# Patient Record
Sex: Female | Born: 1935 | Race: Black or African American | Hispanic: No | Smoking: Never smoker
Health system: Southern US, Community
[De-identification: ages and names within clinical notes are randomized; demographics above are authoritative.]

## PROBLEM LIST (undated history)

## (undated) DIAGNOSIS — F0280 Dementia in other diseases classified elsewhere without behavioral disturbance: Secondary | ICD-10-CM

## (undated) DIAGNOSIS — R7301 Impaired fasting glucose: Secondary | ICD-10-CM

## (undated) DIAGNOSIS — I10 Essential (primary) hypertension: Secondary | ICD-10-CM

## (undated) DIAGNOSIS — S32000A Wedge compression fracture of unspecified lumbar vertebra, initial encounter for closed fracture: Secondary | ICD-10-CM

## (undated) DIAGNOSIS — Z862 Personal history of diseases of the blood and blood-forming organs and certain disorders involving the immune mechanism: Secondary | ICD-10-CM

## (undated) DIAGNOSIS — G40209 Localization-related (focal) (partial) symptomatic epilepsy and epileptic syndromes with complex partial seizures, not intractable, without status epilepticus: Secondary | ICD-10-CM

## (undated) DIAGNOSIS — H269 Unspecified cataract: Secondary | ICD-10-CM

## (undated) DIAGNOSIS — I4891 Unspecified atrial fibrillation: Secondary | ICD-10-CM

## (undated) DIAGNOSIS — M1990 Unspecified osteoarthritis, unspecified site: Secondary | ICD-10-CM

## (undated) DIAGNOSIS — E559 Vitamin D deficiency, unspecified: Secondary | ICD-10-CM

## (undated) DIAGNOSIS — I951 Orthostatic hypotension: Secondary | ICD-10-CM

## (undated) DIAGNOSIS — K219 Gastro-esophageal reflux disease without esophagitis: Secondary | ICD-10-CM

## (undated) DIAGNOSIS — G309 Alzheimer's disease, unspecified: Secondary | ICD-10-CM

## (undated) HISTORY — PX: NO PAST SURGERIES: SHX2092

## (undated) HISTORY — DX: Vitamin D deficiency, unspecified: E55.9

## (undated) HISTORY — DX: Personal history of diseases of the blood and blood-forming organs and certain disorders involving the immune mechanism: Z86.2

## (undated) HISTORY — DX: Unspecified cataract: H26.9

## (undated) HISTORY — DX: Localization-related (focal) (partial) symptomatic epilepsy and epileptic syndromes with complex partial seizures, not intractable, without status epilepticus: G40.209

## (undated) HISTORY — DX: Impaired fasting glucose: R73.01

---

## 2001-02-08 ENCOUNTER — Other Ambulatory Visit: Admission: RE | Admit: 2001-02-08 | Discharge: 2001-02-08 | Payer: Self-pay | Admitting: Internal Medicine

## 2004-02-09 ENCOUNTER — Emergency Department (HOSPITAL_COMMUNITY): Admission: EM | Admit: 2004-02-09 | Discharge: 2004-02-09 | Payer: Self-pay | Admitting: Emergency Medicine

## 2004-02-20 ENCOUNTER — Encounter: Admission: RE | Admit: 2004-02-20 | Discharge: 2004-02-20 | Payer: Self-pay | Admitting: Family Medicine

## 2004-04-02 ENCOUNTER — Encounter (INDEPENDENT_AMBULATORY_CARE_PROVIDER_SITE_OTHER): Payer: Self-pay | Admitting: *Deleted

## 2004-04-02 ENCOUNTER — Inpatient Hospital Stay (HOSPITAL_COMMUNITY): Admission: EM | Admit: 2004-04-02 | Discharge: 2004-04-03 | Payer: Self-pay | Admitting: Emergency Medicine

## 2009-09-07 ENCOUNTER — Encounter: Admission: RE | Admit: 2009-09-07 | Discharge: 2009-09-07 | Payer: Self-pay | Admitting: Family Medicine

## 2010-07-06 ENCOUNTER — Emergency Department (HOSPITAL_COMMUNITY): Admission: EM | Admit: 2010-07-06 | Discharge: 2010-07-06 | Payer: Self-pay | Admitting: Emergency Medicine

## 2010-08-22 ENCOUNTER — Inpatient Hospital Stay (HOSPITAL_COMMUNITY): Admission: EM | Admit: 2010-08-22 | Discharge: 2010-08-24 | Payer: Self-pay | Admitting: Emergency Medicine

## 2010-08-22 ENCOUNTER — Encounter: Payer: Self-pay | Admitting: Emergency Medicine

## 2010-08-23 ENCOUNTER — Encounter (INDEPENDENT_AMBULATORY_CARE_PROVIDER_SITE_OTHER): Payer: Self-pay | Admitting: Internal Medicine

## 2010-08-24 ENCOUNTER — Ambulatory Visit: Payer: Self-pay | Admitting: Vascular Surgery

## 2010-08-24 ENCOUNTER — Encounter (INDEPENDENT_AMBULATORY_CARE_PROVIDER_SITE_OTHER): Payer: Self-pay | Admitting: Family Medicine

## 2010-08-24 ENCOUNTER — Encounter (INDEPENDENT_AMBULATORY_CARE_PROVIDER_SITE_OTHER): Payer: Self-pay | Admitting: Internal Medicine

## 2011-02-02 LAB — CBC
HCT: 37.3 % (ref 36.0–46.0)
Hemoglobin: 11.4 g/dL — ABNORMAL LOW (ref 12.0–15.0)
Hemoglobin: 12.7 g/dL (ref 12.0–15.0)
MCH: 31.1 pg (ref 26.0–34.0)
MCHC: 33.4 g/dL (ref 30.0–36.0)
MCHC: 34 g/dL (ref 30.0–36.0)
MCV: 91.4 fL (ref 78.0–100.0)
Platelets: 232 10*3/uL (ref 150–400)
RBC: 3.74 MIL/uL — ABNORMAL LOW (ref 3.87–5.11)
RBC: 4.08 MIL/uL (ref 3.87–5.11)
RDW: 14.9 % (ref 11.5–15.5)
WBC: 8.3 10*3/uL (ref 4.0–10.5)

## 2011-02-02 LAB — DIFFERENTIAL
Basophils Absolute: 0 10*3/uL (ref 0.0–0.1)
Basophils Relative: 0 % (ref 0–1)
Eosinophils Absolute: 0 10*3/uL (ref 0.0–0.7)
Eosinophils Relative: 0 % (ref 0–5)
Lymphocytes Relative: 18 % (ref 12–46)
Lymphs Abs: 1.5 10*3/uL (ref 0.7–4.0)
Monocytes Absolute: 0.8 10*3/uL (ref 0.1–1.0)
Monocytes Relative: 10 % (ref 3–12)
Neutro Abs: 5.9 10*3/uL (ref 1.7–7.7)
Neutrophils Relative %: 72 % (ref 43–77)

## 2011-02-02 LAB — POCT CARDIAC MARKERS
CKMB, poc: 1.2 ng/mL (ref 1.0–8.0)
CKMB, poc: 1.5 ng/mL (ref 1.0–8.0)
Myoglobin, poc: 102 ng/mL (ref 12–200)
Myoglobin, poc: 76.6 ng/mL (ref 12–200)
Troponin i, poc: <0.05 ng/mL (ref 0.00–0.09)
Troponin i, poc: <0.05 ng/mL (ref 0.00–0.09)

## 2011-02-02 LAB — BASIC METABOLIC PANEL
BUN: 13 mg/dL (ref 6–23)
CO2: 25 mEq/L (ref 19–32)
CO2: 26 mEq/L (ref 19–32)
CO2: 27 mEq/L (ref 19–32)
Calcium: 8.5 mg/dL (ref 8.4–10.5)
Calcium: 9 mg/dL (ref 8.4–10.5)
Chloride: 101 mEq/L (ref 96–112)
Chloride: 101 mEq/L (ref 96–112)
Chloride: 107 mEq/L (ref 96–112)
Creatinine, Ser: 0.74 mg/dL (ref 0.4–1.2)
GFR calc Af Amer: >60 mL/min (ref >60)
GFR calc Af Amer: >60 mL/min (ref >60)
GFR calc non Af Amer: >60 mL/min (ref >60)
Glucose, Bld: 105 mg/dL — ABNORMAL HIGH (ref 70–99)
Glucose, Bld: 136 mg/dL — ABNORMAL HIGH (ref 70–99)
Potassium: 4 mEq/L (ref 3.5–5.1)
Potassium: 4.2 mEq/L (ref 3.5–5.1)
Sodium: 133 mEq/L — ABNORMAL LOW (ref 135–145)
Sodium: 135 mEq/L (ref 135–145)
Sodium: 138 mEq/L (ref 135–145)

## 2011-02-02 LAB — LIPID PANEL
Cholesterol: 169 mg/dL (ref 0–200)
HDL: 105 mg/dL (ref >39)
LDL Cholesterol: 57 mg/dL (ref 0–99)
Total CHOL/HDL Ratio: 1.6 RATIO
Triglycerides: 33 mg/dL (ref <150)
VLDL: 7 mg/dL (ref 0–40)

## 2011-02-02 LAB — CK TOTAL AND CKMB (NOT AT ARMC)
CK, MB: 2.1 ng/mL (ref 0.3–4.0)
Relative Index: INVALID (ref 0.0–2.5)
Total CK: 79 U/L (ref 7–177)

## 2011-02-02 LAB — GLUCOSE, CAPILLARY: Glucose-Capillary: 125 mg/dL — ABNORMAL HIGH (ref 70–99)

## 2011-02-02 LAB — CARDIAC PANEL(CRET KIN+CKTOT+MB+TROPI)
CK, MB: 14.4 ng/mL (ref 0.3–4.0)
Relative Index: 3.1 — ABNORMAL HIGH (ref 0.0–2.5)

## 2011-02-02 LAB — TROPONIN I: Troponin I: 0.02 ng/mL (ref 0.00–0.06)

## 2011-02-02 LAB — TSH: TSH: 0.99 u[IU]/mL (ref 0.350–4.500)

## 2011-02-03 LAB — URINALYSIS, ROUTINE W REFLEX MICROSCOPIC
Glucose, UA: NEGATIVE mg/dL
Nitrite: NEGATIVE
Protein, ur: NEGATIVE mg/dL
Urobilinogen, UA: 0.2 mg/dL (ref 0.0–1.0)

## 2011-04-08 NOTE — Discharge Summary (Signed)
[name], [name]                           ACCOUNT NO.:  [id number]   MEDICAL RECORD NO.:  [id number]                   PATIENT TYPE:  INP   LOCATION:  3735                                 FACILITY:  [facility]   PHYSICIAN:  Jackie Plum, M.D.             DATE OF BIRTH:  Aug 06, 1922   DATE OF ADMISSION:  04/01/2004  DATE OF DISCHARGE:  04/03/2004                                 DISCHARGE SUMMARY   DISCHARGE DIAGNOSES:  1. Episode of dizziness and visual changes of unknown etiology.     A. Computed tomography scan of the head, carotid Doppler unremarkable.        Two-dimensional echocardiogram showed ejection fraction of 50 to 55%        with normal regional wall motion abnormality without echocardiographic        evidence of embolism. Magnetic resonance imaging and magnetic        resonance angiography (MRI/MRA) official report pending, preliminary        report per Delia Heady negative for any acute changes; official        report will need to be reviewed per primary care physician or        neurologist.     B. Etiology of patient's spells is unclear, and she is being discharged        home on aspirin 81 mg p.o. q.d.     C. Lipid panel, homocysteine, and hemoglobin A1c had been ordered prior        to discharge and will need to be followed up per primary care        physician.  2. History of mild dementia.   ACTIVITY:  As tolerated. Fall precautions.   CONSULTATIONS:  Dr. Lesia Sago and Dr. Pearlean Brownie of neurology.   PROCEDURE:  Not applicable.   CONDITION ON DISCHARGE:  Improved and satisfactory.   FOLLOWUP APPOINTMENT:  Will be with Dr. Pearlean Brownie of neurology service in about  two to three weeks. The patient's family is to call for appointment. She is  to follow up with her primary care physician routinely as needed.   REASON FOR HOSPITALIZATION:  Weakness, visual changes, and dizziness. Please  see admission H&P by Dr. Cammie Mcgee L. Lendell Caprice dictated on Apr 01, 2004  __________ and  full H&P. She is an 77 year old lady with no significant  prior medical history who presented to the hospital with recurrent episodes  of left eye visual dimming and right hemiparesis with some mid dizziness.  There is no history of palpitations, speech disturbances, or syncopal  episodes on review with the patient. She has had some headaches, however.   ADMISSION PHYSICAL EXAMINATION:  GENERAL:  According to admission H&P by Dr.  Crista Curb, admitting BP was 144/85, pulse rate of 116, saturation of  99% on room air.  HEENT:  Pupils are equal, round, and reactive to light. There were no  funduscopic abnormalities.  Her mucous membranes were moist with clear  oropharynx.  NECK:  Supple without any carotid bruit.  LUNGS:  Clear to auscultation.  CARDIAC:  Negative for any gallops or murmur. Rate was regular with regular  rhythm.  ABDOMEN:  Soft and nontender.  NEUROLOGICAL:  Nonfocal.   She was therefore admitted to the hospital for probable near syncopal with  visual changes and hemiparesis to rule out account stroke.   HOSPITAL COURSE:  She was admitted to the hospitalist service. There were no  significant dysrhythmias on telemetry monitoring. She was not orthostatic,  and there was no evidence of myocardial infarction from a cardiac enzyme  __________ hospital. ESR did not show any significant elevation. The patient  was seen in consultation by Dr. Lesia Sago on Apr 02, 2004. He agreed with  plan of care. It was recommended that we rule out left carotid distribution  TIA with MRI/MRA. MRI/MRA was done yesterday and reviewed by Dr. Pearlean Brownie this  morning, and they are said to be within normal. It did not show any acute  changes to explain her symptomatology. Dr. Pearlean Brownie therefore feels it is  appropriate to continue patient at least for now on aspirin, discharge her  home for outpatient followup with the neurology service. Etiology of this  spell is unclear at this  time. No further neurologic evaluation is  recommended. On rounds this morning, the patient feels well. She is not  having any chest pain, shortness of breath, dizziness, extremity weakness,  or visual changes. She denies any palpitations. Her vital signs are notable  for BP of 124/77, pulse 65, respiratory rate 20, temperature 98.8,  saturation 98% on room air. She is alert and oriented x3. She does not have  any acute focal deficits. Her neck is supple. No JVD. No carotid bruit.  Pulmonary auscultation notable for decreased breath sounds without any  wheezes or crackles. Cardiac exam notable for regular rate and rhythm with  no gallops or murmur. Abdomen is soft and nontender. Extremity exam did not  reveal any edema, and she was discharged home with outpatient followup as  outlined above.   DISCHARGE LABORATORY DATA:  WBC count 5.9, hemoglobin 12.2, hematocrit 36.1,  MCV 91.2, platelet count 261. Sodium 138, potassium 4.2, chloride 105,  glucose 137, BUN 12, creatinine 0.5. TSH 0.725.                                                Jackie Plum, M.D.    GO/MEDQ  D:  04/03/2004  T:  04/03/2004  Job:  [id number]   cc:   Lavonda Jumbo, M.D.  Fax: [phone]   Pramod P. Pearlean Brownie, MD  Fax: [phone]   C. Lesia Sago, M.D.  [address]  [address]  [place]  [place] [address]  Fax: [phone]

## 2011-04-08 NOTE — Consult Note (Signed)
[name], [name]                           ACCOUNT NO.:  [id number]   MEDICAL RECORD NO.:  [id number]                   PATIENT TYPE:  INP   LOCATION:  3735                                 FACILITY:  [facility]   PHYSICIAN:  Marlan Palau, M.D.               DATE OF BIRTH:  1922/07/19   DATE OF CONSULTATION:  04/02/2004  DATE OF DISCHARGE:                                   CONSULTATION   REFERRING PHYSICIAN:  Dr. Greggory Stallion Osei-Bonsu.   HISTORY OF PRESENT ILLNESS:  [name] is an 77 year old right-handed  black female, born 01-21-1922, with a history that is relatively  unremarkable.  The patient denies any prior surgical or medical history per  se.  The patient comes to the hospital for an evaluation of transient  episodes of left eye visual dimming and right hemiparesis.  The patient  notes the episodes have recurred at least 3-4 times over the last 5 days and  tend to resolve within about 30 minutes.  The patient notes that 1 episode  is about like the next.  The patient denies any palpitations of the heart,  does have some headache, denies any speech problems or blackout episodes.  The patient has had a CAT scan of the brain that is normal and carotid  Doppler study that has been done that is unremarkable.  The patient is  continuing her workup at this point for a possible TIA event.  Neurology is  asked to see this patient for further evaluation.  The patient again denies  loss of consciousness, just loss of vision.   PAST MEDICAL HISTORY:  Past medical history is significant for:  1. Episodes of left eye visual dimming, right hemiparesis as above, rule out     TIA.  2. Otherwise, past medical history is unremarkable.   ALLERGIES:  The patient has no known allergies, does not smoke or drink.   CURRENT MEDICATIONS:  1. Lovenox 40 mg daily.  2. Tylenol if needed.  3. Phenergan if needed.  4. Ambien 5 mg as needed for sleep.   SOCIAL HISTORY:  [place],  is widowed, has 10 children living; 4 children have passed away, 1 during  childbirth.   FAMILY MEDICAL HISTORY:  Family medical history is notable in that mother  died with TB, father died of old age.  The patient has had 3 siblings that  have passed away, 1 with pneumonia, 1 with stroke.  One sister died with  complications of diabetes.  No cancer is noted in the family.   REVIEW OF SYSTEMS:  Review of systems is notable for no fevers or chills,  occasional headache; denies neck pain; denies shortness of breath; does have  occasional chest pain; denies palpitations of the heart and denies any  abdominal pain, nausea or vomiting, troubles controlling the bowels or  bladder, dizziness or blackout episodes.   PHYSICAL EXAMINATION:  VITALS:  Blood pressure is 131/84, heart rate 72,  respiratory rate 16, temperature -- afebrile.  GENERAL:  In general, this patient is a fairly well-developed, elderly black  female who is alert and cooperative at the time of examination.  HEENT:  Head is atraumatic.  Eyes:  Pupils are round, reactive to light.  Cataracts are present bilaterally.  Disks are flat bilaterally.  NECK:  Neck supple.  No carotid bruits noted.  RESPIRATORY:  Examination is clear.  CARDIOVASCULAR:  Examination reveals a regular rate and rhythm with no  obvious murmurs or rubs noted.  EXTREMITIES:  Extremities are without significant edema.  NEUROLOGIC:  Cranial nerves as above.  Facial symmetry is present.  The  patient notes good pinprick sensation on the face bilaterally, has good  strength to facial muscles and the muscles with head-turning and shoulder  shrugs bilaterally.  Speech is well-enunciated and not aphasic.  Motor  testing reveals 5/5 strength in all 4's.  Good and symmetric motor tone is  noted throughout.  Sensory testing is intact to pinprick, soft touch,  vibratory sensation throughout.  Deep tendon reflexes are symmetric and   normal.  Toes are downgoing bilaterally.  Gait is relatively unremarkable.  Patient is able to walk without assistance.  Romberg negative.  No evidence  of pronator drift is seen.  Again, full visual fields are noted to double  simultaneous stimuli.   LABORATORY VALUES:  Laboratory values are notable for white count of 5.9,  hemoglobin of 12.2, hematocrit of 36.1, MCV of 91.2; sed rate of 27; sodium  138, potassium 4.2, chloride of 105, glucose of 137, BUN of 12, creatinine  of 0.7; TSH of 0.725; troponin I less than 0.05.   CT of the head is as above.   IMPRESSION:  Transient episodes of left eye dimming with vision and right  hemiparesis, rule out left carotid distribution transient ischemic attack  events.   The patient has a relatively unremarkable past medical history otherwise.  The patient is undertaking a workup to find the cause of the above events.   PLAN:  1. MRI scan of the brain is ordered; I agree with this study.  2. MRI angiogram of intracranial and extracranial vessels.  3. Two-dimensional echocardiogram has been done and results are pending.  4. Aspirin therapy for now.   I will follow the patient's clinical course while in house.  Thank you very  much.                                               Marlan Palau, M.D.    CKW/MEDQ  D:  04/02/2004  T:  04/03/2004  Job:  [id number]   cc:   Jackie Plum, M.D.   [address] [place], [place], [place] [facility]   [facility]

## 2011-04-08 NOTE — H&P (Signed)
[name], [name]                           ACCOUNT NO.:  [id number]   MEDICAL RECORD NO.:  [id number]                   PATIENT TYPE:  INP   LOCATION:  1825                                 FACILITY:  [facility]   PHYSICIAN:  Corinna L. Lendell Caprice, MD             DATE OF BIRTH:  14-Jan-1922   DATE OF ADMISSION:  04/01/2004  DATE OF DISCHARGE:                                HISTORY & PHYSICAL   CHIEF COMPLAINT:  Weakness and blackout spells.   HISTORY OF PRESENT ILLNESS:  [name] is a pleasant 77 year old black female  who has essentially no chronic medical problems and who presents to the  emergency room with an episode of dizziness, blindness and weakness lasting  for a few minutes.  She also had a headache, which lasted a few minutes.  She has no headache currently; and, currently she feels back to usual.  She  has had these spells periodically over the past month or so.  She is  slightly demented and her daughter suspects that this may have been  occurring more than she is telling family members as she tends to minimize  things.  She also told an aide that she fell the other day.   The patient has no history of stroke.  She does not wear glasses and has no  ophthalmic problems.   PAST MEDICAL HISTORY:  None.   ALLERGIES:  None.   MEDICATIONS:  None.   SOCIAL HISTORY:  The patient lives with her daughter who is a cardiac care  unit nurse at [employer].  She is quite independent, there is an aide  that comes daily for three hours.  She has had 14 children.  She does not  smoke or drink.  She used to work in a tobacco plant, but is now retired.   FAMILY HISTORY:  Family history is noncontributory.   REVIEW OF SYSTEMS:  HEENT:  As above.  No reported slurred speech or  swallowing difficulties.  RESPIRATORY:  No shortness of breath or cough.  CARDIOVASCULAR: No chest pain.  No palpitations.  GASTROINTESTINAL:  No  nausea, vomiting or diarrhea.  Sometimes she has lower  abdominal pain and  she had a CAT scan which showed nothing acute recently.  The patient is  scheduled for a colonoscopy to evaluate history of anemia.  GENITOURINARY:  No dysuria.  MUSCULOSKELETAL:  No arthralgias or myalgias, but sometimes she  gets swelling in her knees.  NEUROLOGIC:  No seizure activity.  PSYCHIATRIC:  No depression or psychosis.  SKIN:  No rash.   PHYSICAL EXAMINATION:  VITAL SIGNS:  Blood pressure is 144/85, temperature  97.8, heart rate 116, respiratory rate 16, and oxygen saturation 99% on room  air.  GENERAL APPEARANCE:  In general the patient is well-nourished, well-  developed and in no acute distress.  HEENT:  Normocephalic and atraumatic.  Pupils equal, round and react to  light.  Sclerae nonicteric.  I see no obvious abnormalities on funduscopic  exam.  Her face is symmetric.  She has moist mucous membranes.  Oropharynx  is clear.  NECK:  Neck is supple.  No carotid bruits.  No thyromegaly or  lymphadenopathy.  LUNGS:  Lungs are clear to auscultation bilaterally without wheezes, rhonchi  or rales.  CARDIOVASCULAR:  Regular rate and rhythm without murmurs, gallops or rubs.  ABDOMEN:  Normal bowel sounds.  Soft nontender and nondistended.  GENITALIA AND RECTAL:  GU and rectal are deferred.  EXTREMITIES:  No clubbing, cyanosis or edema.  Pulses are intact.  NEUROLOGIC:  The patient is alert and oriented times three.  Cranial nerves  are intact.  Motor strength 5/5.  Deep tendon reflexes are symmetric.  Babinski's negative.  I did not test gait as she does not have her own room  and she is here in the hallway.  SKIN:  No rash.  PSYCHIATRIC:  Normal affect.   LABORATORY DATA:  The patient's H&H are normal.  Basic metabolic panel is  unremarkable.  CPK, MB and troponin are normal.  EKG shows normal sinus  rhythm with sinus arrhythmia and right bundle branch block; no old EKG for  comparison.   ASSESSMENT AND PLAN:  1. Several episodes of near syncope with  transient blindness and weakness,     lasting a few minutes each.   A)  I will get a computerized axial tomographic scan of the brain to rule  out stroke or mass.  B)  She will be monitored on telemetry to rule out arrhythmias.  C)  I will also check a urinalysis to rule out urosepsis, though I doubt  this.  D)  I will also check a white count if this was not checked in the emergency  room.  E)  I will also check orthostatics, but the daughter reports that these  episodes do not seem to be related to her standing.  F)  I will check an electrocardiogram, although I hear no murmur suggestive  of aortic stenosis.  G)  I will also check carotid Dopplers to rule out stenosis.  H)  Check a erythrocyte sedimentation rate, though I feel that the history  is not typical for temporal arteritis.  A. She may end up needing an magnetic resonance imaging/magnetic resonance     angiogram of the brain.   1. History of anemia.  The patient's hemoglobin today is fine, but I will check three hemoccults as  she was supposed to do this  with Dr. Lynelle Doctor.   1. Right bundle branch block.                                                Corinna L. Lendell Caprice, MD    CLS/MEDQ  D:  04/02/2004  T:  04/02/2004  Job:  [id number]   cc:   Lavonda Jumbo, M.D.  [facility]

## 2011-04-08 NOTE — Discharge Summary (Signed)
[name], IGLESIAS                           ACCOUNT NO.:  [id number]   MEDICAL RECORD NO.:  [id number]                   PATIENT TYPE:  INP   LOCATION:  3735                                 FACILITY:  [facility]   PHYSICIAN:  Jackie Plum, M.D.             DATE OF BIRTH:  1922/04/11   DATE OF ADMISSION:  04/01/2004  DATE OF DISCHARGE:                                 DISCHARGE SUMMARY   There was no dictation for this job.                                                Jackie Plum, M.D.    GO/MEDQ  D:  04/03/2004  T:  04/03/2004  Job:  [id number]   cc:   Dr. Randa Evens   Dr. Madilyn Fireman

## 2013-05-21 ENCOUNTER — Emergency Department (HOSPITAL_COMMUNITY): Payer: Medicare Other

## 2013-05-21 ENCOUNTER — Encounter (HOSPITAL_COMMUNITY): Payer: Self-pay

## 2013-05-21 ENCOUNTER — Observation Stay (HOSPITAL_COMMUNITY)
Admission: EM | Admit: 2013-05-21 | Discharge: 2013-05-23 | Disposition: A | Discharge status: Home / Self Care | Payer: Medicare Other | Attending: Internal Medicine | Admitting: Internal Medicine

## 2013-05-21 DIAGNOSIS — I4891 Unspecified atrial fibrillation: Secondary | ICD-10-CM | POA: Diagnosis present

## 2013-05-21 DIAGNOSIS — R1110 Vomiting, unspecified: Secondary | ICD-10-CM | POA: Insufficient documentation

## 2013-05-21 DIAGNOSIS — G309 Alzheimer's disease, unspecified: Secondary | ICD-10-CM

## 2013-05-21 DIAGNOSIS — F0390 Unspecified dementia without behavioral disturbance: Secondary | ICD-10-CM | POA: Insufficient documentation

## 2013-05-21 DIAGNOSIS — F0280 Dementia in other diseases classified elsewhere without behavioral disturbance: Secondary | ICD-10-CM | POA: Diagnosis present

## 2013-05-21 DIAGNOSIS — R079 Chest pain, unspecified: Principal | ICD-10-CM | POA: Insufficient documentation

## 2013-05-21 DIAGNOSIS — I10 Essential (primary) hypertension: Secondary | ICD-10-CM | POA: Diagnosis present

## 2013-05-21 DIAGNOSIS — R0789 Other chest pain: Secondary | ICD-10-CM | POA: Diagnosis present

## 2013-05-21 DIAGNOSIS — R0989 Other specified symptoms and signs involving the circulatory and respiratory systems: Secondary | ICD-10-CM | POA: Insufficient documentation

## 2013-05-21 DIAGNOSIS — R0609 Other forms of dyspnea: Secondary | ICD-10-CM | POA: Insufficient documentation

## 2013-05-21 HISTORY — DX: Unspecified atrial fibrillation: I48.91

## 2013-05-21 HISTORY — DX: Essential (primary) hypertension: I10

## 2013-05-21 HISTORY — DX: Wedge compression fracture of unspecified lumbar vertebra, initial encounter for closed fracture: S32.000A

## 2013-05-21 HISTORY — DX: Orthostatic hypotension: I95.1

## 2013-05-21 HISTORY — DX: Unspecified osteoarthritis, unspecified site: M19.90

## 2013-05-21 LAB — CBC
HCT: 39.7 % (ref 36.0–46.0)
Hemoglobin: 13 g/dL (ref 12.0–15.0)
MCH: 29.1 pg (ref 26.0–34.0)
MCHC: 32.7 g/dL (ref 30.0–36.0)
MCV: 88.8 fL (ref 78.0–100.0)
Platelets: 216 10*3/uL (ref 150–400)
RBC: 4.47 MIL/uL (ref 3.87–5.11)
RDW: 16.2 % — ABNORMAL HIGH (ref 11.5–15.5)
WBC: 5.9 10*3/uL (ref 4.0–10.5)

## 2013-05-21 LAB — BASIC METABOLIC PANEL WITH GFR
BUN: 22 mg/dL (ref 6–23)
CO2: 27 meq/L (ref 19–32)
Calcium: 9.3 mg/dL (ref 8.4–10.5)
Creatinine, Ser: 0.73 mg/dL (ref 0.50–1.10)
GFR calc non Af Amer: 74 mL/min — ABNORMAL LOW (ref >90)
Glucose, Bld: 198 mg/dL — ABNORMAL HIGH (ref 70–99)

## 2013-05-21 LAB — BASIC METABOLIC PANEL
Chloride: 105 mEq/L (ref 96–112)
GFR calc Af Amer: 85 mL/min — ABNORMAL LOW (ref >90)
Potassium: 3.9 mEq/L (ref 3.5–5.1)
Sodium: 142 mEq/L (ref 135–145)

## 2013-05-21 LAB — POCT I-STAT TROPONIN I: Troponin i, poc: 0.02 ng/mL (ref 0.00–0.08)

## 2013-05-21 MED ORDER — NITROGLYCERIN 0.4 MG SL SUBL
0.4000 mg | SUBLINGUAL_TABLET | SUBLINGUAL | Status: DC | PRN
  Administered 2013-05-21: 0.4 mg via SUBLINGUAL

## 2013-05-21 MED ORDER — SODIUM CHLORIDE 0.9 % IV SOLN
INTRAVENOUS | Status: DC
  Administered 2013-05-22 (×2): via INTRAVENOUS

## 2013-05-21 MED ORDER — ENOXAPARIN SODIUM 30 MG/0.3ML ~~LOC~~ SOLN
30.0000 mg | SUBCUTANEOUS | Status: DC
  Administered 2013-05-22: 30 mg via SUBCUTANEOUS
  Filled 2013-05-21 (×2): qty 0.3

## 2013-05-21 MED ORDER — HYDROMORPHONE HCL PF 1 MG/ML IJ SOLN: 0.5000 mg | INTRAMUSCULAR | Status: DC | PRN

## 2013-05-21 MED ORDER — ACETAMINOPHEN 650 MG RE SUPP: 650.0000 mg | Freq: Four times a day (QID) | RECTAL | Status: DC | PRN

## 2013-05-21 MED ORDER — ZOLPIDEM TARTRATE 5 MG PO TABS: 5.0000 mg | ORAL_TABLET | Freq: Every evening | ORAL | Status: DC | PRN

## 2013-05-21 MED ORDER — NITROGLYCERIN 2 % TD OINT
0.5000 [in_us] | TOPICAL_OINTMENT | Freq: Four times a day (QID) | TRANSDERMAL | Status: DC
  Administered 2013-05-22 (×2): 0.5 [in_us] via TOPICAL
  Filled 2013-05-21: qty 30

## 2013-05-21 MED ORDER — ACETAMINOPHEN 325 MG PO TABS: 650.0000 mg | ORAL_TABLET | Freq: Four times a day (QID) | ORAL | Status: DC | PRN

## 2013-05-21 MED ORDER — OXYCODONE HCL 5 MG PO TABS: 5.0000 mg | ORAL_TABLET | ORAL | Status: DC | PRN

## 2013-05-21 MED ORDER — ONDANSETRON HCL 4 MG/2ML IJ SOLN: 4.0000 mg | Freq: Four times a day (QID) | INTRAMUSCULAR | Status: DC | PRN

## 2013-05-21 MED ORDER — ONDANSETRON HCL 4 MG PO TABS: 4.0000 mg | ORAL_TABLET | Freq: Four times a day (QID) | ORAL | Status: DC | PRN

## 2013-05-21 MED ORDER — ALUM & MAG HYDROXIDE-SIMETH 200-200-20 MG/5ML PO SUSP: 30.0000 mL | Freq: Four times a day (QID) | ORAL | Status: DC | PRN

## 2013-05-21 NOTE — ED Notes (Signed)
Phlebotomy at bedside.

## 2013-05-21 NOTE — ED Provider Notes (Signed)
History    CSN: [id number] Arrival date & time 05/21/13  2142  First MD Initiated Contact with Patient 05/21/13 2211     Chief Complaint  Patient presents with  . Chest Pain   (Consider location/radiation/quality/duration/timing/severity/associated sxs/prior Treatment) HPI Comments: Pt w/ PMHx of a-fib, dementia and HTN now w/ chest pain. No hx of CAD, no prior cath or stress test. States sx started yesterday, substernal pressure radiating into left arm. A/w dyspnea and emesis x 1. Denies recent fever/cough. Orthopnea or LE edema.   Patient is a 79 y.o. female presenting with chest pain. The history is provided by the patient. No language interpreter was used.  Chest Pain Pain location:  Substernal area Pain quality: pressure   Pain radiates to:  L arm Pain radiates to the back: no   Pain severity:  Moderate Onset quality:  Gradual Duration:  1 day Timing:  Constant Progression:  Worsening Chronicity:  New Associated symptoms: nausea, shortness of breath and vomiting   Associated symptoms: no abdominal pain, no cough, no dizziness, no fever and no headache    Past Medical History  Diagnosis Date  . A-fib   . Syncope due to orthostatic hypotension   . Dementia   . Osteoarthritis   . Lumbar compression fracture   . Hypertension    History reviewed. No pertinent past surgical history. No family history on file. History  Substance Use Topics  . Smoking status: Never Smoker   . Smokeless tobacco: Never Used  . Alcohol Use: No   OB History   Grav Para Term Preterm Abortions TAB SAB Ect Mult Living                 Review of Systems  Constitutional: Negative for fever and chills.  HENT: Negative for congestion and sore throat.   Respiratory: Positive for shortness of breath. Negative for cough.   Cardiovascular: Positive for chest pain. Negative for leg swelling.  Gastrointestinal: Positive for nausea and vomiting. Negative for abdominal pain, diarrhea and  constipation.  Genitourinary: Negative for dysuria and frequency.  Skin: Negative for color change and rash.  Neurological: Negative for dizziness and headaches.  Psychiatric/Behavioral: Negative for confusion and agitation.  All other systems reviewed and are negative.    Allergies  Review of patient's allergies indicates no known allergies.  Home Medications   Current Outpatient Rx  Name  Route  Sig  Dispense  Refill  . diclofenac sodium (VOLTAREN) 1 % GEL   Topical   Apply 2 g topically 4 (four) times daily.         . divalproex (DEPAKOTE) 125 MG DR tablet   Oral   Take 125 mg by mouth 2 (two) times daily.         Marland Kitchen donepezil (ARICEPT) 10 MG tablet   Oral   Take 10 mg by mouth at bedtime.         Marland Kitchen LORazepam (ATIVAN) 0.5 MG tablet   Oral   Take 0.5 mg by mouth daily as needed for anxiety.         . Memantine HCl ER (NAMENDA XR) 28 MG CP24   Oral   Take 1 capsule by mouth daily.         . metoprolol tartrate (LOPRESSOR) 25 MG tablet   Oral   Take 12.5 mg by mouth 2 (two) times daily.         . risperiDONE (RISPERDAL) 0.5 MG tablet   Oral   Take 0.5  mg by mouth 2 (two) times daily.         . traMADol (ULTRAM) 50 MG tablet   Oral   Take 50 mg by mouth 2 (two) times daily as needed for pain.          BP 137/75  Temp(Src) 98 F (36.7 C) (Oral)  Resp 12  SpO2 100% Physical Exam  Vitals reviewed. Constitutional: She is oriented to person, place, and time. She appears well-developed and well-nourished. No distress.  HENT:  Head: Normocephalic and atraumatic.  Eyes: EOM are normal. Pupils are equal, round, and reactive to light.  Neck: Normal range of motion. Neck supple.  Cardiovascular: Normal rate and regular rhythm.   Pulses:      Radial pulses are 2+ on the right side, and 2+ on the left side.  Pulmonary/Chest: Effort normal and breath sounds normal. No respiratory distress.  Abdominal: Soft. She exhibits no distension.   Musculoskeletal: Normal range of motion. She exhibits no edema.       Right lower leg: She exhibits no edema.       Left lower leg: She exhibits no edema.  Neurological: She is alert and oriented to person, place, and time.  Skin: Skin is warm and dry.  Psychiatric: She has a normal mood and affect. Her behavior is normal.    ED Course  Procedures (including critical care time) Labs Reviewed  CBC - Abnormal; Notable for the following:    RDW 16.2 (*)    All other components within normal limits  BASIC METABOLIC PANEL  POCT I-STAT TROPONIN I   Results for orders placed during the hospital encounter of 05/21/13  CBC      Result Value Range   WBC 5.9  4.0 - 10.5 K/uL   RBC 4.47  3.87 - 5.11 MIL/uL   Hemoglobin 13.0  12.0 - 15.0 g/dL   HCT 57.8  46.9 - 62.9 %   MCV 88.8  78.0 - 100.0 fL   MCH 29.1  26.0 - 34.0 pg   MCHC 32.7  30.0 - 36.0 g/dL   RDW 52.8 (*) 41.3 - 24.4 %   Platelets 216  150 - 400 K/uL  BASIC METABOLIC PANEL      Result Value Range   Sodium 142  135 - 145 mEq/L   Potassium 3.9  3.5 - 5.1 mEq/L   Chloride 105  96 - 112 mEq/L   CO2 27  19 - 32 mEq/L   Glucose, Bld 198 (*) 70 - 99 mg/dL   BUN 22  6 - 23 mg/dL   Creatinine, Ser 0.10  0.50 - 1.10 mg/dL   Calcium 9.3  8.4 - 27.2 mg/dL   GFR calc non Af Amer 74 (*) >90 mL/min   GFR calc Af Amer 85 (*) >90 mL/min  POCT I-STAT TROPONIN I      Result Value Range   Troponin i, poc 0.02  0.00 - 0.08 ng/mL   Comment 3            ZDG:UYQIHK sinus rhythm, RBBB.  Dg Chest Port 1 View  05/21/2013   *RADIOLOGY REPORT*  Clinical Data: Chest pain.  PORTABLE CHEST - 1 VIEW  Comparison: None.  Findings: Low volume chest.  Patient is rotated to the right. Tortuous thoracic aorta.  Basilar atelectasis.  No focal consolidation.  Apical scarring is present.  Glenohumeral osteoarthritis and AC joint osteoarthritis. Monitoring leads are projected over the chest. Cardiopericardial silhouette appears within normal limits allowing  for  low volumes and projection.  IMPRESSION: Low volume chest without gross acute cardiopulmonary disease.   Original Report Authenticated By: Andreas [place], M.D.   No diagnosis found.  MDM  Exam as above, concern for ACS, ECG - no acute ischemia - RBBB. Already received ASA 325mg , will give SL nitro. Will eval w/ CXR, troponin, CBC, BMP and coags. Doubt PE, dissection.   Course: chest pain free, troponin neg, labs unremarkable, chest xray - NACPF. D/w internal medicine pt admitted for ACS rule out.   I have personally reviewed labs and imaging and considered in my MDM. Case d/w Dr Laneta Simmers, MD 05/22/13 [phone]

## 2013-05-21 NOTE — ED Notes (Signed)
History provided by daughter at bedside. Patient c/o midsternal nonradiating CP since last night and throughout the day today. No diaphoresis, fevers, chills, lower extremity swelling, headache or dizziness. No falls or injury. No N/V or abd pain. Patient ate breakfast this AM without difficulty but did not eat dinner tonight because her "chest was hurting too much."

## 2013-05-21 NOTE — ED Notes (Signed)
From home; pt has hx dementia. Poor historian. Per family, patient has been having nonradiating upper left sided CP x 1 day along with right arm pain. Denies any CP currently. However, patient is very anxious regarding going to hospital. Thinks that she will have to stay and when family told her they were calling EMS, patient stated that she was "fine" and not having any pain or discomfort. Per EMS, patient does not appear to be in any distress. Hx A-fib. SR with right BBB with EMS. Received ASA 324 mg per family prior to EMS arrival.

## 2013-05-22 ENCOUNTER — Encounter (HOSPITAL_COMMUNITY): Payer: Self-pay | Admitting: Internal Medicine

## 2013-05-22 DIAGNOSIS — I10 Essential (primary) hypertension: Secondary | ICD-10-CM

## 2013-05-22 DIAGNOSIS — I4891 Unspecified atrial fibrillation: Secondary | ICD-10-CM | POA: Diagnosis present

## 2013-05-22 DIAGNOSIS — R072 Precordial pain: Secondary | ICD-10-CM

## 2013-05-22 DIAGNOSIS — G309 Alzheimer's disease, unspecified: Secondary | ICD-10-CM | POA: Diagnosis present

## 2013-05-22 DIAGNOSIS — F0280 Dementia in other diseases classified elsewhere without behavioral disturbance: Secondary | ICD-10-CM

## 2013-05-22 LAB — CBC
HCT: 34 % — ABNORMAL LOW (ref 36.0–46.0)
Hemoglobin: 11.2 g/dL — ABNORMAL LOW (ref 12.0–15.0)
MCV: 88.3 fL (ref 78.0–100.0)
RDW: 16.4 % — ABNORMAL HIGH (ref 11.5–15.5)
WBC: 6.1 10*3/uL (ref 4.0–10.5)

## 2013-05-22 LAB — BASIC METABOLIC PANEL
BUN: 18 mg/dL (ref 6–23)
CO2: 22 mEq/L (ref 19–32)
Chloride: 104 mEq/L (ref 96–112)
Creatinine, Ser: 0.68 mg/dL (ref 0.50–1.10)
Potassium: 3.6 mEq/L (ref 3.5–5.1)

## 2013-05-22 LAB — TROPONIN I: Troponin I: <0.3 ng/mL (ref <0.30)

## 2013-05-22 MED ORDER — LORAZEPAM 2 MG/ML IJ SOLN
0.2500 mg | Freq: Once | INTRAMUSCULAR | Status: AC
  Administered 2013-05-22: 0.25 mg via INTRAVENOUS
  Filled 2013-05-22: qty 1

## 2013-05-22 MED ORDER — RISPERIDONE 0.5 MG PO TABS
0.5000 mg | ORAL_TABLET | Freq: Two times a day (BID) | ORAL | Status: DC
  Administered 2013-05-22 (×2): 0.5 mg via ORAL
  Filled 2013-05-22 (×5): qty 1

## 2013-05-22 MED ORDER — LORAZEPAM 0.5 MG PO TABS
0.5000 mg | ORAL_TABLET | Freq: Every day | ORAL | Status: DC | PRN
  Administered 2013-05-22: 0.5 mg via ORAL
  Filled 2013-05-22: qty 1

## 2013-05-22 MED ORDER — DONEPEZIL HCL 10 MG PO TABS
10.0000 mg | ORAL_TABLET | Freq: Every day | ORAL | Status: DC
  Administered 2013-05-22: 10 mg via ORAL
  Filled 2013-05-22 (×3): qty 1

## 2013-05-22 MED ORDER — ENOXAPARIN SODIUM 40 MG/0.4ML ~~LOC~~ SOLN
40.0000 mg | Freq: Every day | SUBCUTANEOUS | Status: DC
  Administered 2013-05-22: 40 mg via SUBCUTANEOUS
  Filled 2013-05-22 (×2): qty 0.4

## 2013-05-22 MED ORDER — OMEPRAZOLE 2 MG/ML ORAL SUSPENSION: 40.0000 mg | Freq: Every day | ORAL | Status: DC

## 2013-05-22 MED ORDER — METOPROLOL TARTRATE 12.5 MG HALF TABLET
12.5000 mg | ORAL_TABLET | Freq: Two times a day (BID) | ORAL | Status: DC
  Administered 2013-05-22: 12.5 mg via ORAL
  Filled 2013-05-22 (×4): qty 1

## 2013-05-22 MED ORDER — PANTOPRAZOLE SODIUM 40 MG PO PACK: 40.0000 mg | PACK | Freq: Every day | ORAL | Status: DC

## 2013-05-22 MED ORDER — PANTOPRAZOLE SODIUM 40 MG PO PACK
80.0000 mg | PACK | Freq: Every day | ORAL | Status: DC
  Administered 2013-05-22: 80 mg via ORAL
  Filled 2013-05-22 (×2): qty 40

## 2013-05-22 MED ORDER — DIVALPROEX SODIUM 125 MG PO CPSP
125.0000 mg | ORAL_CAPSULE | Freq: Two times a day (BID) | ORAL | Status: DC
  Administered 2013-05-22: 125 mg via ORAL
  Filled 2013-05-22 (×5): qty 1

## 2013-05-22 MED ORDER — MEMANTINE HCL ER 28 MG PO CP24
1.0000 | ORAL_CAPSULE | Freq: Every day | ORAL | Status: DC
  Administered 2013-05-22: 28 mg via ORAL
  Filled 2013-05-22 (×2): qty 28

## 2013-05-22 MED ORDER — DIVALPROEX SODIUM 125 MG PO DR TAB
125.0000 mg | DELAYED_RELEASE_TABLET | Freq: Two times a day (BID) | ORAL | Status: DC
  Filled 2013-05-22: qty 1

## 2013-05-22 MED ORDER — PANTOPRAZOLE SODIUM 40 MG PO TBEC: 40.0000 mg | DELAYED_RELEASE_TABLET | Freq: Every day | ORAL | Status: DC

## 2013-05-22 NOTE — Progress Notes (Signed)
  Echocardiogram 2D Echocardiogram limited has been performed due to patient's noncompliance.  Cathie Beams 05/22/2013, 2:33 PM

## 2013-05-22 NOTE — Progress Notes (Signed)
Patient is lethargic. BP=98/62 HR=60.  Patient had received 0.5 mg PO Ativan at 1236.  I notified Dr. Jomarie Longs.  She stated it was probably from po ativan and that we will keep her in hospital over night and discharge in early AM.

## 2013-05-22 NOTE — ED Provider Notes (Signed)
I saw and evaluated the patient, reviewed the resident's note and I agree with the findings and plan.  Intermittent CP's, pain free currently, concern for ACS.  No prior h/o ACS, not followed by cardiology.  Pt has dementia.  ECG shows RBBB, no acute ischemia.  Will need admission and rule out due to worsening CP's, more frequent with risk factors including age, HTN.     No distress, not toxic appearing Lungs clear RRR, occasional ectopy abd soft, non tender   ECG at time 21:51 shows SR at rate 74, RBBB, PAC's, no ST or T wave abn's.  Abn ECG without acute ischemia.    Impression: Chest pain  [name]. Oletta Lamas, MD 05/22/13 1610

## 2013-05-22 NOTE — Progress Notes (Signed)
Pt seen and examined, admitted this am with atypical chest pain, cardiac enzymes negative so far Check Echo, add PPI Dementia with behavioral problems  Zannie Cove, MD [phone]

## 2013-05-22 NOTE — H&P (Signed)
[facility] History and Physical  [name]:[id number] DOB: 09-May-1922 DOA: 05/21/2013  Referring physician: EDP PCP: Cain Saupe, MD  Specialists:   Chief Complaint:  Chest Pain  HPI: [name] is a 79 y.o. female with Alzheimer's Dementia brought to the Ed due to complaints of substernal chest pain  Radiating into the left arm associated with SOB since last night.   The patient can not give a history due to Dementia, and the history has been given by the daughter who is her primary caretaker.  Her daughter reports that she had complained off and on since yesterday and could not eat today due to the discomfort and difficulty breathing.   The ED cardiac workup was initially negative and she was referred for medical admission.        Review of Systems:  Unable to Obtain from the Patient   Past Medical History  Diagnosis Date  . A-fib   . Syncope due to orthostatic hypotension   . Dementia   . Osteoarthritis   . Lumbar compression fracture   . Hypertension      History reviewed. No pertinent past surgical history.    Prior to Admission medications   Medication Sig Start Date End Date Taking? Authorizing Provider  diclofenac sodium (VOLTAREN) 1 % GEL Apply 2 g topically 4 (four) times daily.   Yes Historical Provider, MD  divalproex (DEPAKOTE) 125 MG DR tablet Take 125 mg by mouth 2 (two) times daily.   Yes Historical Provider, MD  donepezil (ARICEPT) 10 MG tablet Take 10 mg by mouth at bedtime.   Yes Historical Provider, MD  LORazepam (ATIVAN) 0.5 MG tablet Take 0.5 mg by mouth daily as needed for anxiety.   Yes Historical Provider, MD  Memantine HCl ER (NAMENDA XR) 28 MG CP24 Take 1 capsule by mouth daily.   Yes Historical Provider, MD  metoprolol tartrate (LOPRESSOR) 25 MG tablet Take 12.5 mg by mouth 2 (two) times daily.   Yes Historical Provider, MD  risperiDONE (RISPERDAL) 0.5 MG tablet Take 0.5 mg by mouth 2 (two) times daily.   Yes Historical Provider, MD   traMADol (ULTRAM) 50 MG tablet Take 50 mg by mouth 2 (two) times daily as needed for pain.   Yes Historical Provider, MD     No Known Allergies    Social History:  Lives with Daughter, Wheelchair bound   reports that she has never smoked. She has never used smokeless tobacco. She reports that she does not drink alcohol or use illicit drugs.      Family History  Problem Relation Age of Onset  . Diabetes Sister   . Cancer Other     grandson had Colon Cancer  . Hypertension Daughter      Physical Exam:  GEN:  Pleasant Elderly thin 79 y.o. African American female  examined  and in no acute distress; cooperative with exam Filed Vitals:   05/21/13 2157 05/21/13 2230 05/21/13 2245 05/21/13 2315  BP:  134/82 125/101 139/93  Pulse:  72 73   Temp:      TempSrc:      Resp:  15 19 19   SpO2: 100% 98% 99%    Blood pressure 139/93, pulse 73, temperature 98 F (36.7 C), temperature source Oral, resp. rate 19, SpO2 99.00%. PSYCH: SHe is alert and oriented x4; does not appear anxious does not appear depressed; affect is normal HEENT: Normocephalic and Atraumatic, Mucous membranes pink; PERRLA; EOM intact; Fundi:  Benign;  No  scleral icterus, Nares: Patent, Oropharynx: Clear, Fair Dentition, Neck:  FROM, no cervical lymphadenopathy nor thyromegaly or carotid bruit; no JVD; Breasts:: Not examined CHEST WALL: No tenderness CHEST: Normal respiration, clear to auscultation bilaterally HEART: Regular rate and rhythm; no murmurs rubs or gallops BACK: No kyphosis or scoliosis; no CVA tenderness ABDOMEN: Positive Bowel Sounds,  soft non-tender; no masses, no organomegaly. Rectal Exam: Not done EXTREMITIES: No cyanosis, clubbing or edema; no ulcerations. Genitalia: not examined PULSES: 2+ and symmetric SKIN: Normal hydration no rash or ulceration CNS: Cranial nerves 2-12 grossly intact no focal neurologic deficit    Labs on Admission:  Basic Metabolic Panel:  Recent Labs Lab  05/21/13 2158  NA 142  K 3.9  CL 105  CO2 27  GLUCOSE 198*  BUN 22  CREATININE 0.73  CALCIUM 9.3   Liver Function Tests: No results found for this basename: AST, ALT, ALKPHOS, BILITOT, PROT, ALBUMIN,  in the last 168 hours No results found for this basename: LIPASE, AMYLASE,  in the last 168 hours No results found for this basename: AMMONIA,  in the last 168 hours CBC:  Recent Labs Lab 05/21/13 2158  WBC 5.9  HGB 13.0  HCT 39.7  MCV 88.8  PLT 216   Cardiac Enzymes: No results found for this basename: CKTOTAL, CKMB, CKMBINDEX, TROPONINI,  in the last 168 hours  BNP (last 3 results) No results found for this basename: PROBNP,  in the last 8760 hours CBG: No results found for this basename: GLUCAP,  in the last 168 hours  Radiological Exams on Admission: Dg Chest Port 1 View  05/21/2013   *RADIOLOGY REPORT*  Clinical Data: Chest pain.  PORTABLE CHEST - 1 VIEW  Comparison: None.  Findings: Low volume chest.  Patient is rotated to the right. Tortuous thoracic aorta.  Basilar atelectasis.  No focal consolidation.  Apical scarring is present.  Glenohumeral osteoarthritis and AC joint osteoarthritis. Monitoring leads are projected over the chest. Cardiopericardial silhouette appears within normal limits allowing for low volumes and projection.  IMPRESSION: Low volume chest without gross acute cardiopulmonary disease.   Original Report Authenticated By: Andreas Newport, M.D.     EKG: Independently reviewed.  Normal sinus rhythm +RBBB, No Acute S-T changes.     Assessment/Plan Principal Problem:   Chest pain, midsternal Active Problems:   Atrial fibrillation   Hypertension   Alzheimer's disease    1.  Chest Pain- Admit to Telemetry bed for monitoring, Cycle troponins, Nitropaste, O2, and ASA ordered.  Continue Metoprolol.    2.  Atrial fibrillation- Must be paroxysmal, not currently on  Anti coagulant rx-high fall risk, Placed on ASA rx.    3.  HTN- monitor BPs, continue  metoprolol Rx.    4.  Alzheimers Dementia- stable on meds, Continue Donezepril, Namenda, and Risperdal.     5.  DVT prophylaxis with Lovenox.      Code Status:   FULL CODE Family Communication:     Disposition Plan:         Time spent:  55 Minutes  Ron Parker [facility] Pager [phone]  If 7PM-7AM, please contact night-coverage [email or web address] Password [email or web address] 05/22/2013, 1:09 AM

## 2013-05-23 NOTE — Progress Notes (Signed)
D/c orders received;IV removed with gauze on, pt remains in stable condition, pt meds and instructions reviewed and given to pt and pt family; pt d/c to home 

## 2013-05-23 NOTE — Discharge Summary (Signed)
Physician Discharge Summary  [name]:[id number] DOB: March 16, 1922 DOA: 05/21/2013  PCP: Cain Saupe, MD  Admit date: 05/21/2013 Discharge date: 05/23/2013  Time spent:45 minutes  Recommendations for Outpatient Follow-up:  PCP in 1 week  Discharge Diagnoses:   Atypical Chest pain   Atrial fibrillation   Hypertension   Alzheimer's disease   Discharge Condition: stable  Diet recommendation: heart healthy  Filed Weights   05/22/13 0110  Weight: 74.753 kg (164 lb 12.8 oz)    History of present illness:  [name] is a 79 y.o. female with Alzheimer's Dementia brought to the Ed due to complaints of substernal chest pain Radiating into the left arm associated with SOB since last night. The patient can not give a history due to Dementia, and the history has been given by the daughter who is her primary caretaker. Her daughter reports that she had complained off and on since yesterday and could not eat today due to the discomfort and difficulty breathing. The ED cardiac workup was initially negative and she was referred for medical admission.    Hospital Course:  Pt seen and examined, admitted yesterday am with atypical chest pain, this was resolved by the time she was admitted, cardiac enzymes negative x3  2D Echo done showed normal EF and no wall motion abnormality. She has clinically remained stable and asymptomatic, PPI was added to her regimen. Dementia with behavioral problems remained stable and she was continued on her chronic home medications  Procedures: 2D ECHO - Left ventricle: The cavity size was normal. There was mild focal basal hypertrophy of the septum. Systolic function was normal. The estimated ejection fraction was in the range of 55% to 60%. Wall motion was normal; there were no regional wall motion abnormalities. - Aortic valve: Trivial regurgitation. - Mitral valve: Calcified annulus. Mildly thickened leaflets . - Right ventricle: The cavity size was  mildly dilated. Wall thickness was normal.    Discharge Exam: Filed Vitals:   05/22/13 2000 05/23/13 0000 05/23/13 0400 05/23/13 0820  BP: 108/64 129/64 114/69 101/60  Pulse: 61 60 69 91  Temp: 97.7 F (36.5 C) 98.4 F (36.9 C) 98.5 F (36.9 C) 98.6 F (37 C)  TempSrc:    Oral  Resp: 18 18 16 16   Height:      Weight:      SpO2: 92% 98% 99% 100%    General: AAOx3 Cardiovascular:S1S2/RRR Respiratory: CTAB  Discharge Instructions  Discharge Orders   Future Orders Complete By Expires     Diet - low sodium heart healthy  As directed     Increase activity slowly  As directed         Medication List         diclofenac sodium 1 % Gel  Commonly known as:  VOLTAREN  Apply 2 g topically 4 (four) times daily.     divalproex 125 MG DR tablet  Commonly known as:  DEPAKOTE  Take 125 mg by mouth 2 (two) times daily.     donepezil 10 MG tablet  Commonly known as:  ARICEPT  Take 10 mg by mouth at bedtime.     LORazepam 0.5 MG tablet  Commonly known as:  ATIVAN  Take 0.5 mg by mouth daily as needed for anxiety.     metoprolol tartrate 25 MG tablet  Commonly known as:  LOPRESSOR  Take 12.5 mg by mouth 2 (two) times daily.     NAMENDA XR 28 MG Cp24  Generic drug:  Memantine HCl ER  Take 1 capsule by mouth daily.     pantoprazole sodium 40 mg/20 mL Pack  Commonly known as:  PROTONIX  Take 20 mLs (40 mg total) by mouth daily.     risperiDONE 0.5 MG tablet  Commonly known as:  RISPERDAL  Take 0.5 mg by mouth 2 (two) times daily.     traMADol 50 MG tablet  Commonly known as:  ULTRAM  Take 50 mg by mouth 2 (two) times daily as needed for pain.       No Known Allergies     Follow-up Information   Follow up with FULP, CAMMIE, MD In 1 week.   Contact information:   [address] [place], [place] [address] [place] [address] [phone]        The results of significant diagnostics from this hospitalization (including imaging, microbiology, ancillary and laboratory)  are listed below for reference.    Significant Diagnostic Studies: Dg Chest Port 1 View  05/21/2013   *RADIOLOGY REPORT*  Clinical Data: Chest pain.  PORTABLE CHEST - 1 VIEW  Comparison: None.  Findings: Low volume chest.  Patient is rotated to the right. Tortuous thoracic aorta.  Basilar atelectasis.  No focal consolidation.  Apical scarring is present.  Glenohumeral osteoarthritis and AC joint osteoarthritis. Monitoring leads are projected over the chest. Cardiopericardial silhouette appears within normal limits allowing for low volumes and projection.  IMPRESSION: Low volume chest without gross acute cardiopulmonary disease.   Original Report Authenticated By: Andreas Newport, M.D.    Microbiology: No results found for this or any previous visit (from the past 240 hour(s)).   Labs: Basic Metabolic Panel:  Recent Labs Lab 05/21/13 2158 05/22/13 0859  NA 142 138  K 3.9 3.6  CL 105 104  CO2 27 22  GLUCOSE 198* 166*  BUN 22 18  CREATININE 0.73 0.68  CALCIUM 9.3 8.8   Liver Function Tests: No results found for this basename: AST, ALT, ALKPHOS, BILITOT, PROT, ALBUMIN,  in the last 168 hours No results found for this basename: LIPASE, AMYLASE,  in the last 168 hours No results found for this basename: AMMONIA,  in the last 168 hours CBC:  Recent Labs Lab 05/21/13 2158 05/22/13 0859  WBC 5.9 6.1  HGB 13.0 11.2*  HCT 39.7 34.0*  MCV 88.8 88.3  PLT 216 207   Cardiac Enzymes:  Recent Labs Lab 05/21/13 2158 05/22/13 0859 05/22/13 1146  TROPONINI <0.30 <0.30 <0.30   BNP: BNP (last 3 results) No results found for this basename: PROBNP,  in the last 8760 hours CBG: No results found for this basename: GLUCAP,  in the last 168 hours     Signed:  Kanishk Stroebel  [facility] 05/23/2013, 5:17 PM

## 2013-06-04 ENCOUNTER — Other Ambulatory Visit: Payer: Self-pay | Admitting: Rheumatology

## 2013-06-04 ENCOUNTER — Ambulatory Visit
Admission: RE | Admit: 2013-06-04 | Discharge: 2013-06-04 | Disposition: A | Discharge status: Home / Self Care | Payer: Medicare Other | Source: Ambulatory Visit | Attending: Rheumatology | Admitting: Rheumatology

## 2013-06-04 DIAGNOSIS — M25562 Pain in left knee: Secondary | ICD-10-CM

## 2013-06-04 DIAGNOSIS — M25561 Pain in right knee: Secondary | ICD-10-CM

## 2013-06-04 DIAGNOSIS — M25552 Pain in left hip: Secondary | ICD-10-CM

## 2013-06-11 ENCOUNTER — Other Ambulatory Visit: Payer: Self-pay | Admitting: Rheumatology

## 2013-06-11 DIAGNOSIS — M25551 Pain in right hip: Secondary | ICD-10-CM

## 2013-06-11 DIAGNOSIS — M25552 Pain in left hip: Secondary | ICD-10-CM

## 2013-06-12 ENCOUNTER — Other Ambulatory Visit: Payer: Self-pay | Admitting: Rheumatology

## 2013-06-12 ENCOUNTER — Ambulatory Visit
Admission: RE | Admit: 2013-06-12 | Discharge: 2013-06-12 | Disposition: A | Discharge status: Home / Self Care | Payer: Medicare Other | Source: Ambulatory Visit | Attending: Rheumatology | Admitting: Rheumatology

## 2013-06-12 DIAGNOSIS — M25552 Pain in left hip: Secondary | ICD-10-CM

## 2013-06-12 DIAGNOSIS — M25551 Pain in right hip: Secondary | ICD-10-CM

## 2013-06-12 MED ORDER — METHYLPREDNISOLONE ACETATE 40 MG/ML INJ SUSP (RADIOLOG: 120.0000 mg | Freq: Once | INTRAMUSCULAR | Status: DC

## 2013-06-12 MED ORDER — IOHEXOL 180 MG/ML  SOLN: 1.0000 mL | Freq: Once | INTRAMUSCULAR | Status: AC | PRN

## 2013-06-25 ENCOUNTER — Ambulatory Visit
Admission: RE | Admit: 2013-06-25 | Discharge: 2013-06-25 | Disposition: A | Discharge status: Home / Self Care | Payer: Medicare Other | Source: Ambulatory Visit | Attending: Rheumatology | Admitting: Rheumatology

## 2013-06-25 DIAGNOSIS — M25551 Pain in right hip: Secondary | ICD-10-CM

## 2013-06-25 MED ORDER — IOHEXOL 180 MG/ML  SOLN
1.0000 mL | Freq: Once | INTRAMUSCULAR | Status: AC | PRN
  Administered 2013-06-25: 1 mL via INTRA_ARTICULAR

## 2013-06-25 MED ORDER — METHYLPREDNISOLONE ACETATE 40 MG/ML INJ SUSP (RADIOLOG
120.0000 mg | Freq: Once | INTRAMUSCULAR | Status: AC
  Administered 2013-06-25: 120 mg via INTRA_ARTICULAR

## 2013-10-22 ENCOUNTER — Ambulatory Visit (INDEPENDENT_AMBULATORY_CARE_PROVIDER_SITE_OTHER): Payer: Medicare Other

## 2013-10-22 VITALS — BP 121/57 | HR 82 | Resp 16 | Ht 67.0 in | Wt 164.0 lb

## 2013-10-22 DIAGNOSIS — E1149 Type 2 diabetes mellitus with other diabetic neurological complication: Secondary | ICD-10-CM

## 2013-10-22 DIAGNOSIS — E1140 Type 2 diabetes mellitus with diabetic neuropathy, unspecified: Secondary | ICD-10-CM

## 2013-10-22 DIAGNOSIS — L608 Other nail disorders: Secondary | ICD-10-CM

## 2013-10-22 DIAGNOSIS — E1142 Type 2 diabetes mellitus with diabetic polyneuropathy: Secondary | ICD-10-CM

## 2013-10-22 DIAGNOSIS — S90121S Contusion of right lesser toe(s) without damage to nail, sequela: Secondary | ICD-10-CM

## 2013-10-22 NOTE — Patient Instructions (Signed)
Diabetes and Foot Care Diabetes may cause you to have problems because of poor blood supply (circulation) to your feet and legs. This may cause the skin on your feet to become thinner, break easier, and heal more slowly. Your skin may become dry, and the skin may peel and crack. You may also have nerve damage in your legs and feet causing decreased feeling in them. You may not notice minor injuries to your feet that could lead to infections or more serious problems. Taking care of your feet is one of the most important things you can do for yourself.  HOME CARE INSTRUCTIONS  Wear shoes at all times, even in the house. Do not go barefoot. Bare feet are easily injured.  Check your feet daily for blisters, cuts, and redness. If you cannot see the bottom of your feet, use a mirror or ask someone for help.  Wash your feet with warm water (do not use hot water) and mild soap. Then pat your feet and the areas between your toes until they are completely dry. Do not soak your feet as this can dry your skin.  Apply a moisturizing lotion or petroleum jelly (that does not contain alcohol and is unscented) to the skin on your feet and to dry, brittle toenails. Do not apply lotion between your toes.  Trim your toenails straight across. Do not dig under them or around the cuticle. File the edges of your nails with an emery board or nail file.  Do not cut corns or calluses or try to remove them with medicine.  Wear clean socks or stockings every day. Make sure they are not too tight. Do not wear knee-high stockings since they may decrease blood flow to your legs.  Wear shoes that fit properly and have enough cushioning. To break in new shoes, wear them for just a few hours a day. This prevents you from injuring your feet. Always look in your shoes before you put them on to be sure there are no objects inside.  Do not cross your legs. This may decrease the blood flow to your feet.  If you find a minor scrape,  cut, or break in the skin on your feet, keep it and the skin around it clean and dry. These areas may be cleansed with mild soap and water. Do not cleanse the area with peroxide, alcohol, or iodine.  When you remove an adhesive bandage, be sure not to damage the skin around it.  If you have a wound, look at it several times a day to make sure it is healing.  Do not use heating pads or hot water bottles. They may burn your skin. If you have lost feeling in your feet or legs, you may not know it is happening until it is too late.  Make sure your health care provider performs a complete foot exam at least annually or more often if you have foot problems. Report any cuts, sores, or bruises to your health care provider immediately. SEEK MEDICAL CARE IF:   You have an injury that is not healing.  You have cuts or breaks in the skin.  You have an ingrown nail.  You notice redness on your legs or feet.  You feel burning or tingling in your legs or feet.  You have pain or cramps in your legs and feet.  Your legs or feet are numb.  Your feet always feel cold. SEEK IMMEDIATE MEDICAL CARE IF:   There is increasing redness,   swelling, or pain in or around a wound.  There is a red line that goes up your leg.  Pus is coming from a wound.  You develop a fever or as directed by your health care provider.  You notice a bad smell coming from an ulcer or wound. Document Released: 11/04/2000 Document Revised: 07/10/2013 Document Reviewed: 04/16/2013 [employer] Patient Information 2014 [employer].  

## 2013-10-22 NOTE — Progress Notes (Signed)
   Subjective:    Patient ID: [name], female    DOB: 1921-12-23, 79 y.o.   MRN: [id number] "Her right big toenail looks like it's coming off.  Dr. Jillyn Hidden wants her nails cut."  Diabetes She presents for her initial diabetic visit. Diabetes type: border line Diabetes. The initial diagnosis of diabetes was made 1 year ago. Her disease course has been stable. Hypoglycemia symptoms include confusion. There are no diabetic associated symptoms. (Loose nail Hallux Right) Symptoms are stable. Diabetic symptoms present: couple of weeks. When asked about current treatments, none were reported. She is compliant with treatment all of the time. Her weight is stable. Exercise: range of motion with arms and legs.      Review of Systems  Musculoskeletal: Positive for gait problem.       Joint pain  Skin:       Change in nails  Psychiatric/Behavioral: Positive for hallucinations and confusion.  All other systems reviewed and are negative.       Objective:   Physical Exam Neurovascular status is intact with pedal pulses palpable DP postal for PT plus one over 4 bilateral Refill timed 3-4 seconds all digits. There is no edema rubor pallor or varicosities noted. Neurologically epicritic sensations diminished on Semmes Weinstein testing to the forefoot toes and digits. There is normal plantar response DTRs not listed dermatologic the skin color pigment normal hair growth absent nails thick yellow discolored criptotic incurvated 1 through 5 bilateral. There is history of contusion to right hallux nail plate with separation of the nailbed history some bleeding discharge treated with some cephalexin and Neosporin and gauze wraps. Currently no signs of infection no signs of paronychia the nail is separated from the nailbed almost the proximal nail fold.       Assessment & Plan:  Assessment this time his diabetes with peripheral neuropathy. There is history of contusion to right hallux nail with lysis of the  nail plate and nailbed the nails are debrided x10 the presence of diabetes at this time partial avulsion of the hallux nail plate without the need for anesthesia is done to the right great toe at this time. Suggested meetings Neosporin to the nail border or nail fold at this time for the right great toe reappointed 3 months for followup continued palliative care in the future as needed  Alvan Dame DPM

## 2013-11-17 ENCOUNTER — Inpatient Hospital Stay (HOSPITAL_COMMUNITY)
Admission: EM | Admit: 2013-11-17 | Discharge: 2013-11-20 | DRG: 070 | Disposition: A | Discharge status: Home / Self Care | Payer: Medicare Other | Attending: Internal Medicine | Admitting: Internal Medicine

## 2013-11-17 ENCOUNTER — Emergency Department (HOSPITAL_COMMUNITY): Payer: Medicare Other

## 2013-11-17 ENCOUNTER — Encounter (HOSPITAL_COMMUNITY): Payer: Self-pay | Admitting: Emergency Medicine

## 2013-11-17 ENCOUNTER — Other Ambulatory Visit (HOSPITAL_COMMUNITY): Payer: Medicare Other

## 2013-11-17 DIAGNOSIS — S065X9A Traumatic subdural hemorrhage with loss of consciousness of unspecified duration, initial encounter: Secondary | ICD-10-CM

## 2013-11-17 DIAGNOSIS — G309 Alzheimer's disease, unspecified: Secondary | ICD-10-CM | POA: Diagnosis present

## 2013-11-17 DIAGNOSIS — R0789 Other chest pain: Secondary | ICD-10-CM

## 2013-11-17 DIAGNOSIS — E41 Nutritional marasmus: Secondary | ICD-10-CM | POA: Diagnosis present

## 2013-11-17 DIAGNOSIS — G9340 Encephalopathy, unspecified: Secondary | ICD-10-CM

## 2013-11-17 DIAGNOSIS — R4182 Altered mental status, unspecified: Secondary | ICD-10-CM

## 2013-11-17 DIAGNOSIS — F0280 Dementia in other diseases classified elsewhere without behavioral disturbance: Secondary | ICD-10-CM

## 2013-11-17 DIAGNOSIS — Z66 Do not resuscitate: Secondary | ICD-10-CM | POA: Diagnosis present

## 2013-11-17 DIAGNOSIS — W19XXXA Unspecified fall, initial encounter: Secondary | ICD-10-CM | POA: Diagnosis present

## 2013-11-17 DIAGNOSIS — I10 Essential (primary) hypertension: Secondary | ICD-10-CM

## 2013-11-17 DIAGNOSIS — E119 Type 2 diabetes mellitus without complications: Secondary | ICD-10-CM | POA: Diagnosis present

## 2013-11-17 DIAGNOSIS — E872 Acidosis, unspecified: Secondary | ICD-10-CM

## 2013-11-17 DIAGNOSIS — N179 Acute kidney failure, unspecified: Secondary | ICD-10-CM | POA: Diagnosis present

## 2013-11-17 DIAGNOSIS — G9341 Metabolic encephalopathy: Principal | ICD-10-CM | POA: Diagnosis present

## 2013-11-17 DIAGNOSIS — N289 Disorder of kidney and ureter, unspecified: Secondary | ICD-10-CM

## 2013-11-17 DIAGNOSIS — E870 Hyperosmolality and hypernatremia: Secondary | ICD-10-CM | POA: Diagnosis present

## 2013-11-17 DIAGNOSIS — I6200 Nontraumatic subdural hemorrhage, unspecified: Secondary | ICD-10-CM

## 2013-11-17 DIAGNOSIS — D72829 Elevated white blood cell count, unspecified: Secondary | ICD-10-CM | POA: Diagnosis present

## 2013-11-17 DIAGNOSIS — Z79899 Other long term (current) drug therapy: Secondary | ICD-10-CM

## 2013-11-17 DIAGNOSIS — S065XAA Traumatic subdural hemorrhage with loss of consciousness status unknown, initial encounter: Secondary | ICD-10-CM

## 2013-11-17 DIAGNOSIS — I4891 Unspecified atrial fibrillation: Secondary | ICD-10-CM

## 2013-11-17 DIAGNOSIS — IMO0002 Reserved for concepts with insufficient information to code with codable children: Secondary | ICD-10-CM

## 2013-11-17 LAB — URINALYSIS W MICROSCOPIC + REFLEX CULTURE
Bilirubin Urine: NEGATIVE
Glucose, UA: 100 mg/dL — AB
Specific Gravity, Urine: 1.018 (ref 1.005–1.030)
Urobilinogen, UA: 1 mg/dL (ref 0.0–1.0)
pH: 5.5 (ref 5.0–8.0)

## 2013-11-17 LAB — COMPREHENSIVE METABOLIC PANEL
ALT: 19 U/L (ref 0–35)
Albumin: 2.8 g/dL — ABNORMAL LOW (ref 3.5–5.2)
Alkaline Phosphatase: 62 U/L (ref 39–117)
BUN: 49 mg/dL — ABNORMAL HIGH (ref 6–23)
Calcium: 9.3 mg/dL (ref 8.4–10.5)
Chloride: 118 mEq/L — ABNORMAL HIGH (ref 96–112)
GFR calc Af Amer: 25 mL/min — ABNORMAL LOW (ref >90)
Glucose, Bld: 172 mg/dL — ABNORMAL HIGH (ref 70–99)
Potassium: 3.9 mEq/L (ref 3.5–5.1)
Sodium: 162 mEq/L (ref 135–145)
Total Bilirubin: 0.9 mg/dL (ref 0.3–1.2)

## 2013-11-17 LAB — CBC WITH DIFFERENTIAL/PLATELET
Basophils Absolute: 0 10*3/uL (ref 0.0–0.1)
Eosinophils Absolute: 0 10*3/uL (ref 0.0–0.7)
Hemoglobin: 12.3 g/dL (ref 12.0–15.0)
Lymphs Abs: 2.2 10*3/uL (ref 0.7–4.0)
Monocytes Relative: 6 % (ref 3–12)
Neutro Abs: 10 10*3/uL — ABNORMAL HIGH (ref 1.7–7.7)
Neutrophils Relative %: 77 % (ref 43–77)
RBC: 4.01 MIL/uL (ref 3.87–5.11)
WBC: 13 10*3/uL — ABNORMAL HIGH (ref 4.0–10.5)

## 2013-11-17 LAB — CG4 I-STAT (LACTIC ACID): Lactic Acid, Venous: 4.56 mmol/L — ABNORMAL HIGH (ref 0.5–2.2)

## 2013-11-17 LAB — POCT I-STAT 3, ART BLOOD GAS (G3+)
Bicarbonate: 12.8 mEq/L — ABNORMAL LOW (ref 20.0–24.0)
Patient temperature: 98.6
TCO2: 14 mmol/L (ref 0–100)
pCO2 arterial: 24.6 mmHg — ABNORMAL LOW (ref 35.0–45.0)
pH, Arterial: 7.324 — ABNORMAL LOW (ref 7.350–7.450)
pO2, Arterial: 274 mmHg — ABNORMAL HIGH (ref 80.0–100.0)

## 2013-11-17 LAB — TROPONIN I: Troponin I: <0.3 ng/mL (ref <0.30)

## 2013-11-17 MED ORDER — SODIUM CHLORIDE 0.9 % IV BOLUS (SEPSIS)
1000.0000 mL | INTRAVENOUS | Status: AC
  Administered 2013-11-17: 1000 mL via INTRAVENOUS

## 2013-11-17 MED ORDER — SODIUM CHLORIDE 0.9 % IV BOLUS (SEPSIS)
1000.0000 mL | Freq: Once | INTRAVENOUS | Status: AC
  Administered 2013-11-17: 1000 mL via INTRAVENOUS

## 2013-11-17 MED ORDER — PIPERACILLIN-TAZOBACTAM 3.375 G IVPB 30 MIN
3.3750 g | Freq: Once | INTRAVENOUS | Status: AC
  Administered 2013-11-17: 3.375 g via INTRAVENOUS
  Filled 2013-11-17: qty 50

## 2013-11-17 MED ORDER — VANCOMYCIN HCL IN DEXTROSE 1-5 GM/200ML-% IV SOLN
1000.0000 mg | Freq: Once | INTRAVENOUS | Status: AC
  Administered 2013-11-17: 1000 mg via INTRAVENOUS
  Filled 2013-11-17: qty 200

## 2013-11-17 NOTE — ED Notes (Addendum)
Pt reports to the ED for eval of AMS x 2 weeks. Upon EMS arrival pt was found to be responsive only to painful stimuli. 12 lead showed atrial fibrillation with RVR and complete BBB in the 130s. Upon EMS arrival O2 sats were in the 50s. Pt placed on NRB and O2 sats 80s-90. CBG 115 mg/dl. Pt HRs 130s-150s at this time. Pt has not been eating for the past 2 weeks or taken any of her medications. Pts daughter denies any N/V/D, or known fevers or chills. Pts daughter states that she would like her to be a DNR but has no papers. Pt hypotensive 99/66.

## 2013-11-17 NOTE — ED Provider Notes (Signed)
CSN: [id number]     Arrival date & time 11/17/13  1455 History   First MD Initiated Contact with Patient 11/17/13 1456     Chief Complaint  Patient presents with  . Altered Mental Status  . Atrial Fibrillation   (Consider location/radiation/quality/duration/timing/severity/associated sxs/prior Treatment) Patient is a 79 y.o. female presenting with altered mental status and atrial fibrillation. The history is provided by a caregiver.  Altered Mental Status Presenting symptoms: behavior changes and confusion   Severity:  Moderate Most recent episode:  Today Episode history:  Continuous Duration:  3 weeks Timing:  Constant Progression:  Worsening Chronicity:  New Context: dementia   Associated symptoms: no abdominal pain, no fever, no headaches, no nausea and no vomiting   Atrial Fibrillation Associated symptoms include shortness of breath. Pertinent negatives include no chest pain, no abdominal pain and no headaches.    Past Medical History  Diagnosis Date  . A-fib   . Syncope due to orthostatic hypotension   . Dementia   . Osteoarthritis   . Lumbar compression fracture   . Hypertension   . Cataract   . Diabetes mellitus without complication    History reviewed. No pertinent past surgical history. Family History  Problem Relation Age of Onset  . Diabetes Sister   . Cancer Other     grandson had Colon Cancer  . Hypertension Daughter    History  Substance Use Topics  . Smoking status: Never Smoker   . Smokeless tobacco: Never Used  . Alcohol Use: No   OB History   Grav Para Term Preterm Abortions TAB SAB Ect Mult Living                 Review of Systems  Constitutional: Positive for activity change and appetite change. Negative for fever and fatigue.  HENT: Negative for congestion and drooling.   Eyes: Negative for pain.  Respiratory: Positive for cough (mild) and shortness of breath.   Cardiovascular: Negative for chest pain.  Gastrointestinal: Negative for  nausea, vomiting, abdominal pain and diarrhea.  Genitourinary: Negative for dysuria and hematuria.  Musculoskeletal: Negative for back pain, gait problem and neck pain.  Skin: Negative for color change.  Neurological: Negative for dizziness and headaches.  Hematological: Negative for adenopathy.  Psychiatric/Behavioral: Positive for confusion. Negative for behavioral problems.  All other systems reviewed and are negative.    Allergies  Review of patient's allergies indicates no known allergies.  Home Medications   Current Outpatient Rx  Name  Route  Sig  Dispense  Refill  . acetaminophen-codeine (TYLENOL #3) 300-30 MG per tablet   Oral   Take 1 tablet by mouth every 6 (six) hours as needed for moderate pain.         . cholecalciferol (VITAMIN D) 1000 UNITS tablet   Oral   Take 1,000 Units by mouth daily.         . diclofenac sodium (VOLTAREN) 1 % GEL   Topical   Apply 2 g topically 4 (four) times daily.         . divalproex (DEPAKOTE) 125 MG DR tablet   Oral   Take 125 mg by mouth 2 (two) times daily.         Marland Kitchen donepezil (ARICEPT) 10 MG tablet   Oral   Take 10 mg by mouth at bedtime.         Marland Kitchen loratadine (CLARITIN) 10 MG tablet   Oral   Take 10 mg by mouth daily.         Marland Kitchen  LORazepam (ATIVAN) 0.5 MG tablet   Oral   Take 0.5 mg by mouth daily as needed for anxiety.         . Memantine HCl ER (NAMENDA XR) 28 MG CP24   Oral   Take 1 capsule by mouth daily.         . metoprolol tartrate (LOPRESSOR) 25 MG tablet   Oral   Take 12.5 mg by mouth 2 (two) times daily.         . pantoprazole sodium (PROTONIX) 40 mg/20 mL PACK   Oral   Take 20 mLs (40 mg total) by mouth daily.   30 each   0   . risperiDONE (RISPERDAL) 0.5 MG tablet   Oral   Take 0.5 mg by mouth 2 (two) times daily.         . traMADol (ULTRAM) 50 MG tablet   Oral   Take 50 mg by mouth 2 (two) times daily as needed for pain.          SpO2 90% Physical Exam  Nursing note  and vitals reviewed. Constitutional: She appears well-developed and well-nourished.  HENT:  Head: Normocephalic.  Mouth/Throat: No oropharyngeal exudate.  Eyes: Conjunctivae and EOM are normal. Pupils are equal, round, and reactive to light.  Neck: Normal range of motion. Neck supple.  Cardiovascular: Regular rhythm, normal heart sounds and intact distal pulses.  Exam reveals no gallop and no friction rub.   No murmur heard. HR 125  Pulmonary/Chest: She is in respiratory distress (mild tachypnea). She has no wheezes.  Abdominal: Soft. Bowel sounds are normal. There is no tenderness. There is no rebound and no guarding.  Musculoskeletal: Normal range of motion. She exhibits no edema and no tenderness.  Several stage I and stage II sacral ulcers noted.  Neurological: She is alert. GCS eye subscore is 2. GCS verbal subscore is 1. GCS motor subscore is 5.  The patient will open her eyes with sternal rub. She will not answer questions or follow commands.  Skin: Skin is dry.  Cool     ED Course  Procedures (including critical care time) Labs Review Labs Reviewed  CBC WITH DIFFERENTIAL - Abnormal; Notable for the following:    WBC 13.0 (*)    RDW 16.9 (*)    Neutro Abs 10.0 (*)    All other components within normal limits  COMPREHENSIVE METABOLIC PANEL - Abnormal; Notable for the following:    Sodium 162 (*)    Chloride 118 (*)    CO2 10 (*)    Glucose, Bld 172 (*)    BUN 49 (*)    Creatinine, Ser 1.97 (*)    Albumin 2.8 (*)    AST 45 (*)    GFR calc non Af Amer 21 (*)    GFR calc Af Amer 25 (*)    All other components within normal limits  PRO B NATRIURETIC PEPTIDE - Abnormal; Notable for the following:    Pro B Natriuretic peptide (BNP) 453.5 (*)    All other components within normal limits  URINALYSIS W MICROSCOPIC + REFLEX CULTURE - Abnormal; Notable for the following:    APPearance CLOUDY (*)    Glucose, UA 100 (*)    Hgb urine dipstick MODERATE (*)    Ketones, ur 15  (*)    Protein, ur 30 (*)    Leukocytes, UA SMALL (*)    Bacteria, UA FEW (*)    Squamous Epithelial / LPF MANY (*)  Casts GRANULAR CAST (*)    All other components within normal limits  LACTIC ACID, PLASMA - Abnormal; Notable for the following:    Lactic Acid, Venous 11.6 (*)    All other components within normal limits  COMPREHENSIVE METABOLIC PANEL - Abnormal; Notable for the following:    Sodium 158 (*)    Potassium 3.0 (*)    Chloride 124 (*)    Glucose, Bld 133 (*)    BUN 52 (*)    Creatinine, Ser 1.84 (*)    Calcium 7.7 (*)    Albumin 2.2 (*)    AST 70 (*)    ALT 37 (*)    GFR calc non Af Amer 23 (*)    GFR calc Af Amer 27 (*)    All other components within normal limits  VALPROIC ACID LEVEL - Abnormal; Notable for the following:    Valproic Acid Lvl <10.0 (*)    All other components within normal limits  CBC WITH DIFFERENTIAL - Abnormal; Notable for the following:    WBC 13.1 (*)    RBC 3.18 (*)    Hemoglobin 9.7 (*)    HCT 30.2 (*)    RDW 16.5 (*)    Platelets 132 (*)    Neutrophils Relative % 84 (*)    Neutro Abs 11.0 (*)    Lymphocytes Relative 11 (*)    All other components within normal limits  GLUCOSE, CAPILLARY - Abnormal; Notable for the following:    Glucose-Capillary 154 (*)    All other components within normal limits  GLUCOSE, CAPILLARY - Abnormal; Notable for the following:    Glucose-Capillary 139 (*)    All other components within normal limits  GLUCOSE, CAPILLARY - Abnormal; Notable for the following:    Glucose-Capillary 117 (*)    All other components within normal limits  POCT I-STAT 3, BLOOD GAS (G3+) - Abnormal; Notable for the following:    pH, Arterial 7.324 (*)    pCO2 arterial 24.6 (*)    pO2, Arterial 274.0 (*)    Bicarbonate 12.8 (*)    Acid-base deficit 12.0 (*)    All other components within normal limits  CG4 I-STAT (LACTIC ACID) - Abnormal; Notable for the following:    Lactic Acid, Venous 4.56 (*)    All other  components within normal limits  URINE CULTURE  TROPONIN I   Imaging Review Ct Head Wo Contrast  11/17/2013   CLINICAL DATA:  Altered mental status  EXAM: CT HEAD WITHOUT CONTRAST  TECHNIQUE: Contiguous axial images were obtained from the base of the skull through the vertex without intravenous contrast. Study was obtained within 24 hr of patient's arrival at the emergency department.  COMPARISON:  Brain CT August 22, 2010 and brain MRI August 23, 2010  FINDINGS: There is moderate diffuse atrophy. Ventricles and proportion or larger than sulci.  There is a subdural hematoma on the left which appear subacute. It has a maximum thickness of 6 mm. There is no other extra-axial fluid. There is no midline shift.  There is no mass or intra-axial hemorrhage. There is mild small vessel disease in the centra semiovale bilaterally. There is no acute infarct.  The bony calvarium appears intact. The mastoid air cells are clear. There is a right parietal scalp lipoma measuring 5.4 by 5.2 cm, stable.  IMPRESSION: Small subacute left subdural hematoma, not causing significant mass effect and no midline shift.  There is atrophy with questionable superimposed communicating hydrocephalus. There is mild small  vessel disease. No intra-axial hemorrhage or acute appearing infarct.  Critical Value/emergent results were called by telephone at the time of interpretation on 11/17/2013 at 7:31 PM to Dr. Randa Spike, Romeo Apple , who verbally acknowledged these results.   Electronically Signed   By: Bretta Bang M.D.   On: 11/17/2013 19:31   Dg Chest Port 1 View  11/17/2013   CLINICAL DATA:  Altered mental status  EXAM: PORTABLE CHEST - 1 VIEW  COMPARISON:  May 21, 2013  FINDINGS: There is no edema or consolidation. Heart is upper normal in size with normal pulmonary vascularity. There is prominence of the thoracic aorta which appears stable. Prominence in the right peritracheal region is probably due to great vessel prominence. There  is no appreciable adenopathy. No bone lesions.  IMPRESSION: Prominent and tortuous aorta. This finding may be due to chronic hypertension. Aneurysmal dilatation cannot be excluded on this study.  No edema or consolidation.   Electronically Signed   By: Bretta Bang M.D.   On: 11/17/2013 16:05    EKG Interpretation    Date/Time:  Sunday November 17 2013 16:30:21 EST Ventricular Rate:  111 PR Interval:  127 QRS Duration: 129 QT Interval:  364 QTC Calculation: 495 R Axis:   44 Text Interpretation:  Sinus tachycardia Atrial premature complexes Right bundle branch block No significant change since last tracing Confirmed by  Grosser  MD, Tymir Terral ([id number]) on 11/18/2013 1:17:03 AM           CRITICAL CARE Performed by: Purvis Sheffield, S Total critical care time: 50 min Critical care time was exclusive of separately billable procedures and treating other patients. Critical care was necessary to treat or prevent imminent or life-threatening deterioration. Critical care was time spent personally by me on the following activities: development of treatment plan with patient and/or surrogate as well as nursing, discussions with consultants, evaluation of patient's response to treatment, examination of patient, obtaining history from patient or surrogate, ordering and performing treatments and interventions, ordering and review of laboratory studies, ordering and review of radiographic studies, pulse oximetry and re-evaluation of patient's condition.  MDM   1. Subdural hematoma   2. Hypernatremia   3. Lactic acidosis   4. Renal insufficiency   5. Acute encephalopathy    3:12 PM 79 y.o. female with a history of A. fib, dementia, diabetes who presents from home with altered mental status. The patient lives with her daughter who has noticed a gradual decline over the last few weeks. She has become less responsive and had decreased oral intake. EMS reports a CBG in the low 100s and an initial  oxygen saturation in the 60s. The patient is hypoxic on room air but stable on a nonrebreather. She has initial soft blood pressures and is tachycardic to 130. She meets SIRS criteria d/t her HR and RR. Will get blood cultures and start broad-spectrum antibiotics vancomycin and Zosyn. Initial GCS of 8 requiring close monitoring. I discussed course of care with the daughter who states that she is the patient's power of attorney. She states that the patient is a DO NOT RESUSCITATE and would not want to be intubated or resuscitated if there was a cardiac arrest or respiratory arrest. Signing DNR paperwork with her daughter now. Will continue the sepsis workup otherwise.  Found to have a SDH. Daughter notes pt had a fall approx 1 mos ago which is likely the cause of her gradual decline. Discussed w/ [facility] (Dr. Phoebe Perch), no f/u CT imaging recommended. Pt does not  need eval by [facility].   Discussed w/ CC. As lactate is decreasing appropriately and mental status improving, will admit to stepdown under the hospitalists care.     Junius Argyle, MD 11/18/13 [phone]

## 2013-11-17 NOTE — ED Notes (Signed)
Pt in CT at this time.

## 2013-11-17 NOTE — ED Notes (Signed)
Unable to obtain enough blood for cultures. EDP aware and verbal order to go ahead and start IV ABT.

## 2013-11-18 ENCOUNTER — Encounter (HOSPITAL_COMMUNITY): Payer: Self-pay | Admitting: *Deleted

## 2013-11-18 DIAGNOSIS — I10 Essential (primary) hypertension: Secondary | ICD-10-CM

## 2013-11-18 DIAGNOSIS — N289 Disorder of kidney and ureter, unspecified: Secondary | ICD-10-CM

## 2013-11-18 DIAGNOSIS — R4182 Altered mental status, unspecified: Secondary | ICD-10-CM

## 2013-11-18 DIAGNOSIS — F0280 Dementia in other diseases classified elsewhere without behavioral disturbance: Secondary | ICD-10-CM

## 2013-11-18 DIAGNOSIS — I4891 Unspecified atrial fibrillation: Secondary | ICD-10-CM

## 2013-11-18 LAB — GLUCOSE, CAPILLARY
Glucose-Capillary: 139 mg/dL — ABNORMAL HIGH (ref 70–99)
Glucose-Capillary: 117 mg/dL — ABNORMAL HIGH (ref 70–99)
Glucose-Capillary: 121 mg/dL — ABNORMAL HIGH (ref 70–99)
Glucose-Capillary: 142 mg/dL — ABNORMAL HIGH (ref 70–99)

## 2013-11-18 LAB — CBC WITH DIFFERENTIAL/PLATELET
Basophils Relative: 0 % (ref 0–1)
Eosinophils Relative: 0 % (ref 0–5)
HCT: 30.2 % — ABNORMAL LOW (ref 36.0–46.0)
Hemoglobin: 9.7 g/dL — ABNORMAL LOW (ref 12.0–15.0)
Lymphs Abs: 1.4 10*3/uL (ref 0.7–4.0)
MCHC: 32.1 g/dL (ref 30.0–36.0)
MCV: 95 fL (ref 78.0–100.0)
Monocytes Absolute: 0.7 10*3/uL (ref 0.1–1.0)
Monocytes Relative: 5 % (ref 3–12)
Neutro Abs: 11 10*3/uL — ABNORMAL HIGH (ref 1.7–7.7)
RBC: 3.18 MIL/uL — ABNORMAL LOW (ref 3.87–5.11)
RDW: 16.5 % — ABNORMAL HIGH (ref 11.5–15.5)

## 2013-11-18 LAB — VALPROIC ACID LEVEL: Valproic Acid Lvl: <10 ug/mL — ABNORMAL LOW (ref 50.0–100.0)

## 2013-11-18 LAB — COMPREHENSIVE METABOLIC PANEL
ALT: 37 U/L — ABNORMAL HIGH (ref 0–35)
AST: 70 U/L — ABNORMAL HIGH (ref 0–37)
Albumin: 2.2 g/dL — ABNORMAL LOW (ref 3.5–5.2)
Alkaline Phosphatase: 48 U/L (ref 39–117)
BUN: 52 mg/dL — ABNORMAL HIGH (ref 6–23)
Calcium: 7.7 mg/dL — ABNORMAL LOW (ref 8.4–10.5)
Chloride: 124 mEq/L — ABNORMAL HIGH (ref 96–112)
Creatinine, Ser: 1.84 mg/dL — ABNORMAL HIGH (ref 0.50–1.10)
Potassium: 3 mEq/L — ABNORMAL LOW (ref 3.5–5.1)
Sodium: 158 mEq/L — ABNORMAL HIGH (ref 135–145)
Total Bilirubin: 0.3 mg/dL (ref 0.3–1.2)
Total Protein: 6.4 g/dL (ref 6.0–8.3)

## 2013-11-18 MED ORDER — LEVOFLOXACIN IN D5W 750 MG/150ML IV SOLN
750.0000 mg | Freq: Once | INTRAVENOUS | Status: AC
  Administered 2013-11-18: 750 mg via INTRAVENOUS
  Filled 2013-11-18: qty 150

## 2013-11-18 MED ORDER — ACETAMINOPHEN 325 MG PO TABS: 650.0000 mg | ORAL_TABLET | Freq: Four times a day (QID) | ORAL | Status: DC | PRN

## 2013-11-18 MED ORDER — LEVOFLOXACIN IN D5W 500 MG/100ML IV SOLN
500.0000 mg | INTRAVENOUS | Status: DC
  Administered 2013-11-20: 01:00:00 500 mg via INTRAVENOUS
  Filled 2013-11-18: qty 100

## 2013-11-18 MED ORDER — ONDANSETRON HCL 4 MG PO TABS: 4.0000 mg | ORAL_TABLET | Freq: Four times a day (QID) | ORAL | Status: DC | PRN

## 2013-11-18 MED ORDER — ONDANSETRON HCL 4 MG/2ML IJ SOLN
4.0000 mg | Freq: Four times a day (QID) | INTRAMUSCULAR | Status: DC | PRN
Start: 2013-11-18 — End: 2013-11-20

## 2013-11-18 MED ORDER — ACETAMINOPHEN 650 MG RE SUPP: 650.0000 mg | Freq: Four times a day (QID) | RECTAL | Status: DC | PRN

## 2013-11-18 MED ORDER — DEXTROSE 5 % IV SOLN
INTRAVENOUS | Status: AC
  Administered 2013-11-18: 125 mL via INTRAVENOUS
  Administered 2013-11-18 (×2): via INTRAVENOUS

## 2013-11-18 MED ORDER — BIOTENE DRY MOUTH MT LIQD
15.0000 mL | Freq: Two times a day (BID) | OROMUCOSAL | Status: DC
  Administered 2013-11-18 – 2013-11-20 (×5): 15 mL via OROMUCOSAL

## 2013-11-18 MED ORDER — CHLORHEXIDINE GLUCONATE 0.12 % MT SOLN
15.0000 mL | Freq: Two times a day (BID) | OROMUCOSAL | Status: DC
  Administered 2013-11-19 – 2013-11-20 (×3): 15 mL via OROMUCOSAL
  Filled 2013-11-18 (×7): qty 15

## 2013-11-18 NOTE — Progress Notes (Signed)
Patient is very lethargic, not alert to person, place, time or situation. Admitted from ED via stretcher and daughter at bedside. Foley in placed per ED nurse for urine retention. Pt is on 2L O2 [place]. Skin is red non blanchable from her mid back down to her legs. Pt will be repositioned every other hour to help with this. Bed is in lowest position, call bell is within reach. Will continue to monitor.   Marcelyn Bruins RN

## 2013-11-18 NOTE — H&P (Signed)
[facility] History and Physical  LEEANA CREER GNF:[id number] DOB: 03/09/1922 DOA: 11/17/2013  Referring physician: ER physician. PCP: Cain Saupe, MD   History obtained from previous chart and patient's daughter and ER physician.  Chief Complaint: Altered mental status.  HPI: [name] is a 79 y.o. female with known history of advanced dementia was brought to the ER after patient was found to be increasingly lethargic less responsive and not eating well. Patient has not been eating well for last 2 weeks and over the last 2-3 days has not taken her medications. In the ER patient was found to be lethargic and hypoxic with tachycardia. Labs reveal hypernatremia, with severe lactic acidosis and acute renal failure. CT head shows subdural hematoma which is subacute. As per patient's daughter patient had a fall last month and at that time she also hit her head. Patient was given 3 L normal saline bolus after which patient's lactic acid improved from 11-4. Patient became more responsive. Patient's tachycardia improved. Initially patient was started on broad-spectrum antibiotics for possible sepsis. Blood cultures have been sent. Patient will be admitted for further management. As per patient's daughter patient has not been eating well but otherwise has not had any nausea vomiting abdominal pain diarrhea or any chest pain or shortness of breath.   Review of Systems: As presented in the history of presenting illness, rest negative.  Past Medical History  Diagnosis Date  . A-fib   . Syncope due to orthostatic hypotension   . Dementia   . Osteoarthritis   . Lumbar compression fracture   . Hypertension   . Cataract   . Diabetes mellitus without complication    History reviewed. No pertinent past surgical history. Social History:  reports that she has never smoked. She has never used smokeless tobacco. She reports that she does not drink alcohol or use illicit drugs. Where does patient  live home. Can patient participate in ADLs? No.  No Known Allergies  Family History:  Family History  Problem Relation Age of Onset  . Diabetes Sister   . Cancer Other     grandson had Colon Cancer  . Hypertension Daughter       Prior to Admission medications   Medication Sig Start Date End Date Taking? Authorizing Provider  acetaminophen-codeine (TYLENOL #3) 300-30 MG per tablet Take 1 tablet by mouth every 6 (six) hours as needed for moderate pain.   Yes Historical Provider, MD  cholecalciferol (VITAMIN D) 1000 UNITS tablet Take 1,000 Units by mouth daily.   Yes Historical Provider, MD  diclofenac sodium (VOLTAREN) 1 % GEL Apply 2 g topically 4 (four) times daily.   Yes Historical Provider, MD  divalproex (DEPAKOTE) 125 MG DR tablet Take 125 mg by mouth 2 (two) times daily.   Yes Historical Provider, MD  donepezil (ARICEPT) 10 MG tablet Take 10 mg by mouth at bedtime.   Yes Historical Provider, MD  loratadine (CLARITIN) 10 MG tablet Take 10 mg by mouth daily.   Yes Historical Provider, MD  LORazepam (ATIVAN) 0.5 MG tablet Take 0.5 mg by mouth daily as needed for anxiety.   Yes Historical Provider, MD  Memantine HCl ER (NAMENDA XR) 28 MG CP24 Take 1 capsule by mouth daily.   Yes Historical Provider, MD  metoprolol tartrate (LOPRESSOR) 25 MG tablet Take 12.5 mg by mouth 2 (two) times daily.   Yes Historical Provider, MD  pantoprazole sodium (PROTONIX) 40 mg/20 mL PACK Take 20 mLs (40 mg total) by mouth  daily. 05/22/13  Yes Zannie Cove, MD  risperiDONE (RISPERDAL) 0.5 MG tablet Take 0.5 mg by mouth 2 (two) times daily.   Yes Historical Provider, MD  traMADol (ULTRAM) 50 MG tablet Take 50 mg by mouth 2 (two) times daily as needed for pain.   Yes Historical Provider, MD    Physical Exam: Filed Vitals:   11/17/13 2145 11/17/13 2215 11/17/13 2245 11/17/13 2359  BP: 111/70 108/83  125/101  Pulse: 91   90  Temp:    97.7 F (36.5 C)  TempSrc:    Axillary  Resp: 17 15  18   SpO2: 98%   100% 100%     General:  Well-developed and moderately nourished.  Eyes: Anicteric no pallor.  ENT: No discharge from the ears eyes nose mouth.  Neck: No mass felt.  Cardiovascular: S1-S2 heard.  Respiratory: No rhonchi after patient's period  Abdomen: Soft nontender bowel sounds present.  Skin: No rash.  Musculoskeletal: No edema.  Psychiatric: Patient is very poorly responsive at this time.  Neurologic: Patient is very poorly responsive and does not follow commands.  Labs on Admission:  Basic Metabolic Panel:  Recent Labs Lab 11/17/13 1512  NA 162*  K 3.9  CL 118*  CO2 10*  GLUCOSE 172*  BUN 49*  CREATININE 1.97*  CALCIUM 9.3   Liver Function Tests:  Recent Labs Lab 11/17/13 1512  AST 45*  ALT 19  ALKPHOS 62  BILITOT 0.9  PROT 8.0  ALBUMIN 2.8*   No results found for this basename: LIPASE, AMYLASE,  in the last 168 hours No results found for this basename: AMMONIA,  in the last 168 hours CBC:  Recent Labs Lab 11/17/13 1512  WBC 13.0*  NEUTROABS 10.0*  HGB 12.3  HCT 39.2  MCV 97.8  PLT 168   Cardiac Enzymes:  Recent Labs Lab 11/17/13 1512  TROPONINI <0.30    BNP (last 3 results)  Recent Labs  11/17/13 1512  PROBNP 453.5*   CBG: No results found for this basename: GLUCAP,  in the last 168 hours  Radiological Exams on Admission: Ct Head Wo Contrast  11/17/2013   CLINICAL DATA:  Altered mental status  EXAM: CT HEAD WITHOUT CONTRAST  TECHNIQUE: Contiguous axial images were obtained from the base of the skull through the vertex without intravenous contrast. Study was obtained within 24 hr of patient's arrival at the emergency department.  COMPARISON:  Brain CT August 22, 2010 and brain MRI August 23, 2010  FINDINGS: There is moderate diffuse atrophy. Ventricles and proportion or larger than sulci.  There is a subdural hematoma on the left which appear subacute. It has a maximum thickness of 6 mm. There is no other extra-axial fluid.  There is no midline shift.  There is no mass or intra-axial hemorrhage. There is mild small vessel disease in the centra semiovale bilaterally. There is no acute infarct.  The bony calvarium appears intact. The mastoid air cells are clear. There is a right parietal scalp lipoma measuring 5.4 by 5.2 cm, stable.  IMPRESSION: Small subacute left subdural hematoma, not causing significant mass effect and no midline shift.  There is atrophy with questionable superimposed communicating hydrocephalus. There is mild small vessel disease. No intra-axial hemorrhage or acute appearing infarct.  Critical Value/emergent results were called by telephone at the time of interpretation on 11/17/2013 at 7:31 PM to Dr. Randa Spike, Romeo Apple , who verbally acknowledged these results.   Electronically Signed   By: Bretta Bang M.D.   On:  11/17/2013 19:31   Dg Chest Port 1 View  11/17/2013   CLINICAL DATA:  Altered mental status  EXAM: PORTABLE CHEST - 1 VIEW  COMPARISON:  May 21, 2013  FINDINGS: There is no edema or consolidation. Heart is upper normal in size with normal pulmonary vascularity. There is prominence of the thoracic aorta which appears stable. Prominence in the right peritracheal region is probably due to great vessel prominence. There is no appreciable adenopathy. No bone lesions.  IMPRESSION: Prominent and tortuous aorta. This finding may be due to chronic hypertension. Aneurysmal dilatation cannot be excluded on this study.  No edema or consolidation.   Electronically Signed   By: Bretta Bang M.D.   On: 11/17/2013 16:05    EKG: Independently reviewed. Sinus tachycardia with RBBB.  Assessment/Plan Principal Problem:   Acute encephalopathy Active Problems:   Alzheimer's disease   Hypernatremia   Lactic acidosis   Subdural hematoma   1. Acute encephalopathy - most likely secondary to metabolic reasons due to severe dehydration from poor intake. At this time patient has already received 2 L of  normal saline bolus. I have placed patient on D5W for patient's hypernatremia. For now we'll continue with antibiotics until we get blood cultures results. Repeat metabolic panel in a.m. Check Depakote levels. I have placed patient n.p.o. for now until patient is more alert and awake. 2. Subacute subdural hematoma - on-call neurosurgeon was consulted by ER physician. At this time neurosurgeon had requested no further workup. 3. Severe hypernatremia - probably from severe dehydration. See #1. 4. Severe lactic acidosis - probably from dehydration. See #1. 5. Acute renal failure probably from dehydration - see #1. 6. Mild leukocytosis - patient has been empirically placed on antibiotics until blood cultures are available. 7. History of atrial fibrillation - on arrival patient was in sinus tachycardia and after fluids patient's rate has improved. Presently in normal sinus rhythm. Continue metoprolol once patient can take orally. If patient becomes tachycardic again will place patient on IV metoprolol. 8. History of borderline diabetes mellitus - closely follow CBGs as patient is placed on D5W for severe hypernatremia. 9. History of dementia - continue home medications once patient can take orally.   I have discussed with patient's daughter, who is also patient's health care power of attorney. At this time patient's daughter wants patient to have minimal interventions including labs and patient is a DO NOT RESUSCITATE.  Code Status: DO NOT RESUSCITATE.  Family Communication: Patient's daughter the bedside.  Disposition Plan: Admit to inpatient.    Raniah Karan N. [facility] Pager [phone].  If 7PM-7AM, please contact night-coverage [email or web address] Password [facility] 11/18/2013, 12:15 AM

## 2013-11-18 NOTE — Progress Notes (Signed)
INITIAL NUTRITION ASSESSMENT  DOCUMENTATION CODES Per approved criteria  -Severe malnutrition in the context of chronic illness   INTERVENTION: Monitor magnesium, potassium, and phosphorus daily for at least 3 days, MD to replete as needed, as pt is at risk for refeeding syndrome given severe malnutrition. Please consult RD if aggressive nutrition interventions desired, such as initiation of nutrition support. Recommend Glucerna Shake po BID, each supplement provides 220 kcal and 10 grams of protein, between meals if/when diet advanced. RD to continue to follow nutrition care plan.  NUTRITION DIAGNOSIS: Inadequate oral intake related to inability to eat as evidenced by NPO status.   Goal: Advancement as tolerated; intake to meet >90% of estimated nutrition needs.  Monitor:  weight trends, lab trends, I/O's, PO intake, supplement tolerance  Reason for Assessment: Malnutrition Screening Tool  79 y.o. female  Admitting Dx: Acute encephalopathy  ASSESSMENT: PMHx significant for advanced dementia and DM. Admitted with increasing lethargy and poor oral intake. Work-up reveals severe dehydration.  Admission sodium was 162, currently 158. NPO. Pt with 15% wt loss since beginning of this month, however, noted pt is also severely dehydrated, so question if weight is also artificially low. Pt meets criteria for severe MALNUTRITION in the context of chronic illness as evidenced by 15% wt loss x 1 month and intake of <75% x at least 1 month.  Potassium low at 3.0 and trending down. Pt is receiving D5W at 125 ml/hr. Question if pt is experiencing refeeding syndrome.  No family at bedside.  Height: Ht Readings from Last 1 Encounters:  11/18/13 5\' 7"  (1.702 m)    Weight: Wt Readings from Last 1 Encounters:  11/17/13 139 lb 14.4 oz (63.458 kg)    Ideal Body Weight: 142 lb  % Ideal Body Weight: 98%  Wt Readings from Last 10 Encounters:  11/17/13 139 lb 14.4 oz (63.458 kg)  10/22/13  164 lb (74.39 kg)  05/22/13 164 lb 12.8 oz (74.753 kg)    Usual Body Weight: 164 lb  % Usual Body Weight: 85%  BMI:  Body mass index is 21.91 kg/(m^2). Normal weight.  Estimated Nutritional Needs: Kcal: 1500 - 1700 Protein: 60 - 70 g Fluid: 1.8 - 2 liters  Skin: intact  Diet Order: NPO  EDUCATION NEEDS: -No education needs identified at this time   Intake/Output Summary (Last 24 hours) at 11/18/13 1411 Last data filed at 11/18/13 0516  Gross per 24 hour  Intake 604.17 ml  Output      0 ml  Net 604.17 ml    Last BM: 12/29  Labs:   Recent Labs Lab 11/17/13 1512 11/18/13 0645  NA 162* 158*  K 3.9 3.0*  CL 118* 124*  CO2 10* 20  BUN 49* 52*  CREATININE 1.97* 1.84*  CALCIUM 9.3 7.7*  GLUCOSE 172* 133*    CBG (last 3)   Recent Labs  11/18/13 0419 11/18/13 0743 11/18/13 1207  GLUCAP 139* 117* 96    Scheduled Meds: . antiseptic oral rinse  15 mL Mouth Rinse q12n4p  . chlorhexidine  15 mL Mouth Rinse BID  . [START ON 11/20/2013] levofloxacin (LEVAQUIN) IV  500 mg Intravenous Q48H    Continuous Infusions: . dextrose 125 mL (11/18/13 1131)    Past Medical History  Diagnosis Date  . A-fib   . Syncope due to orthostatic hypotension   . Dementia   . Osteoarthritis   . Lumbar compression fracture   . Hypertension   . Cataract   . Diabetes mellitus without  complication     History reviewed. No pertinent past surgical history.  Jarold Motto MS, RD, LDN Pager: [phone] After-hours pager: [phone]

## 2013-11-18 NOTE — Progress Notes (Signed)
[facility] PROGRESS NOTE  [name] RUE:[id number] DOB: 06/04/1922 DOA: 11/17/2013 PCP: Cain Saupe, MD  Assessment/Plan: 1. Acute encephalopathy - etiology uncertain at this point. Most likely multifactorial. Patient presented with dehydration and currently being rehydrated with IV fluids. Minimal interaction with me today. Per notes daughter did not want aggressive intervention. Further workup pending and will followup with test results. - Blood cultures not obtained initially and patient has been on IV antibiotics as such we'll not obtain currently - Urine culture pending, chest x-ray does not report any consolidations.  2. Subacute subdural hematoma - on-call neurosurgeon was consulted by ER physician. At this time neurosurgeon had requested no further workup.  - Plan will be for physical therapy with improvement in condition  3. Severe hypernatremia  - probably from severe dehydration. See #1.   4. Severe lactic acidosis  - probably from dehydration. See #1.   5. Acute renal failure probably from dehydration  - see #1. - Serum creatinine improving with IV fluid rehydration. Most likely prerenal causes given dehydration -Will reassess serum creatinine next a.m.  6.  Mild leukocytosis  -Patient has been started on broad-spectrum IV antibiotics at this point we'll plan on continuing. This also could be secondary to recent stress from number 2.  7. History of atrial fibrillation  - Patient currently n.p.o. secondary to hypersomnolence most likely secondary to #1. - Blood pressure soft most likely due to hypovolemia secondary to severe dehydration. At this point we'll hold off on beta blocker administration but should patient's heart rate become elevated we'll consider IV Lopressor.  8. History of borderline diabetes mellitus  -Will continue to closely follow CBGs as patient is placed on D5W for severe hypernatremia.   9.  History of dementia - continue home medications  once patient can take oral intake.   Code Status: DNR Family Communication: No family at bedside, we'll plan on updating daughter next a.m. Disposition Plan: Should patient's condition not improve with continued IV fluid rehydration given multiple comorbidities it may be reasonable to place palliative consult for course of care and consideration of hospice   Consultants:  None  Procedures:  None  Antibiotics:  Currently on IV Levaquin   HPI/Subjective: Minimal interaction with examiner. No new complaints reported by nursing overnight.  Objective: Filed Vitals:   11/18/13 1400  BP: 109/69  Pulse: 90  Temp: 97.3 F (36.3 C)  Resp: 18    Intake/Output Summary (Last 24 hours) at 11/18/13 1702 Last data filed at 11/18/13 1456  Gross per 24 hour  Intake 604.17 ml  Output    450 ml  Net 154.17 ml   Filed Weights   11/17/13 2359  Weight: 63.458 kg (139 lb 14.4 oz)    Exam:   General:  Patient hyper-somnolent, in no acute distress  Cardiovascular: Normal S1 and S2, no murmurs  Respiratory: Clear to auscultation bilaterally, no increased work of breathing  Abdomen: Soft, nondistended, nontender  Musculoskeletal: No cyanosis, no clubbing  Data Reviewed: Basic Metabolic Panel:  Recent Labs Lab 11/17/13 1512 11/18/13 0645  NA 162* 158*  K 3.9 3.0*  CL 118* 124*  CO2 10* 20  GLUCOSE 172* 133*  BUN 49* 52*  CREATININE 1.97* 1.84*  CALCIUM 9.3 7.7*   Liver Function Tests:  Recent Labs Lab 11/17/13 1512 11/18/13 0645  AST 45* 70*  ALT 19 37*  ALKPHOS 62 48  BILITOT 0.9 0.3  PROT 8.0 6.4  ALBUMIN 2.8* 2.2*   No results found for  this basename: LIPASE, AMYLASE,  in the last 168 hours No results found for this basename: AMMONIA,  in the last 168 hours CBC:  Recent Labs Lab 11/17/13 1512 11/18/13 0645  WBC 13.0* 13.1*  NEUTROABS 10.0* 11.0*  HGB 12.3 9.7*  HCT 39.2 30.2*  MCV 97.8 95.0  PLT 168 132*   Cardiac Enzymes:  Recent  Labs Lab 11/17/13 1512  TROPONINI <0.30   BNP (last 3 results)  Recent Labs  11/17/13 1512  PROBNP 453.5*   CBG:  Recent Labs Lab 11/18/13 0030 11/18/13 0419 11/18/13 0743 11/18/13 1207 11/18/13 1637  GLUCAP 154* 139* 117* 96 121*    No results found for this or any previous visit (from the past 240 hour(s)).   Studies: Ct Head Wo Contrast  11/17/2013   CLINICAL DATA:  Altered mental status  EXAM: CT HEAD WITHOUT CONTRAST  TECHNIQUE: Contiguous axial images were obtained from the base of the skull through the vertex without intravenous contrast. Study was obtained within 24 hr of patient's arrival at the emergency department.  COMPARISON:  Brain CT August 22, 2010 and brain MRI August 23, 2010  FINDINGS: There is moderate diffuse atrophy. Ventricles and proportion or larger than sulci.  There is a subdural hematoma on the left which appear subacute. It has a maximum thickness of 6 mm. There is no other extra-axial fluid. There is no midline shift.  There is no mass or intra-axial hemorrhage. There is mild small vessel disease in the centra semiovale bilaterally. There is no acute infarct.  The bony calvarium appears intact. The mastoid air cells are clear. There is a right parietal scalp lipoma measuring 5.4 by 5.2 cm, stable.  IMPRESSION: Small subacute left subdural hematoma, not causing significant mass effect and no midline shift.  There is atrophy with questionable superimposed communicating hydrocephalus. There is mild small vessel disease. No intra-axial hemorrhage or acute appearing infarct.  Critical Value/emergent results were called by telephone at the time of interpretation on 11/17/2013 at 7:31 PM to Dr. Randa Spike, Romeo Apple , who verbally acknowledged these results.   Electronically Signed   By: Bretta Bang M.D.   On: 11/17/2013 19:31   Dg Chest Port 1 View  11/17/2013   CLINICAL DATA:  Altered mental status  EXAM: PORTABLE CHEST - 1 VIEW  COMPARISON:  May 21, 2013   FINDINGS: There is no edema or consolidation. Heart is upper normal in size with normal pulmonary vascularity. There is prominence of the thoracic aorta which appears stable. Prominence in the right peritracheal region is probably due to great vessel prominence. There is no appreciable adenopathy. No bone lesions.  IMPRESSION: Prominent and tortuous aorta. This finding may be due to chronic hypertension. Aneurysmal dilatation cannot be excluded on this study.  No edema or consolidation.   Electronically Signed   By: Bretta Bang M.D.   On: 11/17/2013 16:05    Scheduled Meds: . antiseptic oral rinse  15 mL Mouth Rinse q12n4p  . chlorhexidine  15 mL Mouth Rinse BID  . [START ON 11/20/2013] levofloxacin (LEVAQUIN) IV  500 mg Intravenous Q48H   Continuous Infusions: . dextrose 125 mL (11/18/13 1131)    Principal Problem:   Acute encephalopathy Active Problems:   Alzheimer's disease   Hypernatremia   Lactic acidosis   Subdural hematoma    Time spent: > 35 minutes    Penny Pia  [facility] Pager [phone] If 7PM-7AM, please contact night-coverage at [email or web address], password [facility] 11/18/2013, 5:02 PM  LOS: 1  day

## 2013-11-18 NOTE — Progress Notes (Addendum)
ANTIBIOTIC CONSULT NOTE - INITIAL  Pharmacy Consult for Levaquin Indication: sepsis/suspected UTI  No Known Allergies  Patient Measurements:   Adjusted Body Weight: n/a   Vital Signs: Temp: 97.7 F (36.5 C) (12/28 2359) Temp src: Axillary (12/28 2359) BP: 125/101 mmHg (12/28 2359) Pulse Rate: 90 (12/28 2359) Intake/Output from previous day:   Intake/Output from this shift:    Labs:  Recent Labs  11/17/13 1512  WBC 13.0*  HGB 12.3  PLT 168  CREATININE 1.97*   The CrCl is unknown because both a height and weight (above a minimum accepted value) are required for this calculation. No results found for this basename: VANCOTROUGH, VANCOPEAK, VANCORANDOM, GENTTROUGH, GENTPEAK, GENTRANDOM, TOBRATROUGH, TOBRAPEAK, TOBRARND, AMIKACINPEAK, AMIKACINTROU, AMIKACIN,  in the last 72 hours   Microbiology: No results found for this or any previous visit (from the past 720 hour(s)).  Medical History: Past Medical History  Diagnosis Date  . A-fib   . Syncope due to orthostatic hypotension   . Dementia   . Osteoarthritis   . Lumbar compression fracture   . Hypertension   . Cataract   . Diabetes mellitus without complication     Medications:  Prescriptions prior to admission  Medication Sig Dispense Refill  . acetaminophen-codeine (TYLENOL #3) 300-30 MG per tablet Take 1 tablet by mouth every 6 (six) hours as needed for moderate pain.      . cholecalciferol (VITAMIN D) 1000 UNITS tablet Take 1,000 Units by mouth daily.      . diclofenac sodium (VOLTAREN) 1 % GEL Apply 2 g topically 4 (four) times daily.      . divalproex (DEPAKOTE) 125 MG DR tablet Take 125 mg by mouth 2 (two) times daily.      Marland Kitchen donepezil (ARICEPT) 10 MG tablet Take 10 mg by mouth at bedtime.      Marland Kitchen loratadine (CLARITIN) 10 MG tablet Take 10 mg by mouth daily.      Marland Kitchen LORazepam (ATIVAN) 0.5 MG tablet Take 0.5 mg by mouth daily as needed for anxiety.      . Memantine HCl ER (NAMENDA XR) 28 MG CP24 Take 1  capsule by mouth daily.      . metoprolol tartrate (LOPRESSOR) 25 MG tablet Take 12.5 mg by mouth 2 (two) times daily.      . pantoprazole sodium (PROTONIX) 40 mg/20 mL PACK Take 20 mLs (40 mg total) by mouth daily.  30 each  0  . risperiDONE (RISPERDAL) 0.5 MG tablet Take 0.5 mg by mouth 2 (two) times daily.      . traMADol (ULTRAM) 50 MG tablet Take 50 mg by mouth 2 (two) times daily as needed for pain.       Assessment: 79 yo female admitted with AMS x 2 weeks, afib w/ RVR and low O2 sats.  Pharmacy asked to begin IV levaquin for sepsis, possible UTI.  Scr 1.97, est CrCl ~ 25 ml/min  Goal of Therapy:  Resolution of infection  Plan:  1. Levaquin 750 mg IV x1, then 500 mg IV q 48 hrs. 2.Pharmacy to sign off  Thank you. Okey Regal, PharmD  11/18/2013

## 2013-11-19 LAB — GLUCOSE, CAPILLARY
Glucose-Capillary: 121 mg/dL — ABNORMAL HIGH (ref 70–99)
Glucose-Capillary: 136 mg/dL — ABNORMAL HIGH (ref 70–99)
Glucose-Capillary: 140 mg/dL — ABNORMAL HIGH (ref 70–99)
Glucose-Capillary: 157 mg/dL — ABNORMAL HIGH (ref 70–99)

## 2013-11-19 LAB — CBC
HCT: 35.8 % — ABNORMAL LOW (ref 36.0–46.0)
Hemoglobin: 11.6 g/dL — ABNORMAL LOW (ref 12.0–15.0)
MCH: 30.4 pg (ref 26.0–34.0)
MCHC: 32.4 g/dL (ref 30.0–36.0)
MCV: 94 fL (ref 78.0–100.0)
RBC: 3.81 MIL/uL — ABNORMAL LOW (ref 3.87–5.11)

## 2013-11-19 LAB — URINE CULTURE

## 2013-11-19 LAB — BASIC METABOLIC PANEL WITH GFR
BUN: 34 mg/dL — ABNORMAL HIGH (ref 6–23)
CO2: 21 meq/L (ref 19–32)
Calcium: 8.5 mg/dL (ref 8.4–10.5)
Chloride: 113 meq/L — ABNORMAL HIGH (ref 96–112)
Creatinine, Ser: 1.27 mg/dL — ABNORMAL HIGH (ref 0.50–1.10)
GFR calc Af Amer: 42 mL/min — ABNORMAL LOW
GFR calc non Af Amer: 36 mL/min — ABNORMAL LOW
Glucose, Bld: 128 mg/dL — ABNORMAL HIGH (ref 70–99)
Potassium: 3.2 meq/L — ABNORMAL LOW (ref 3.7–5.3)
Sodium: 149 meq/L — ABNORMAL HIGH (ref 137–147)

## 2013-11-19 LAB — C-REACTIVE PROTEIN: CRP: 13 mg/dL — ABNORMAL HIGH

## 2013-11-19 LAB — LACTIC ACID, PLASMA: Lactic Acid, Venous: 2.5 mmol/L — ABNORMAL HIGH (ref 0.5–2.2)

## 2013-11-19 LAB — SEDIMENTATION RATE: Sed Rate: 60 mm/h — ABNORMAL HIGH (ref 0–22)

## 2013-11-19 MED ORDER — DONEPEZIL HCL 10 MG PO TABS
10.0000 mg | ORAL_TABLET | Freq: Every day | ORAL | Status: DC
  Administered 2013-11-19: 22:00:00 10 mg via ORAL
  Filled 2013-11-19 (×2): qty 1

## 2013-11-19 MED ORDER — DIVALPROEX SODIUM 125 MG PO DR TAB
125.0000 mg | DELAYED_RELEASE_TABLET | Freq: Two times a day (BID) | ORAL | Status: DC
  Administered 2013-11-19 – 2013-11-20 (×2): 125 mg via ORAL
  Filled 2013-11-19 (×6): qty 1

## 2013-11-19 MED ORDER — MEMANTINE HCL ER 28 MG PO CP24
28.0000 mg | ORAL_CAPSULE | Freq: Every day | ORAL | Status: DC
  Administered 2013-11-20: 10:00:00 28 mg via ORAL
  Filled 2013-11-19 (×2): qty 28

## 2013-11-19 MED ORDER — DEXTROSE 5 % IV SOLN
INTRAVENOUS | Status: AC
  Administered 2013-11-19 – 2013-11-20 (×2): 75 mL via INTRAVENOUS

## 2013-11-19 NOTE — Clinical Documentation Improvement (Signed)
Possible Clinical Conditions?  Severe Malnutrition   Protein Calorie Malnutrition Severe Protein Calorie Malnutrition Other Condition Cannot clinically determine  Supporting Information: NUTRITION ASSESSMENT BY by Haynes Bast, RD at 11/18/2013  2:11 PM  DOCUMENTATION CODES  Per approved criteria  -Severe malnutrition in the context of chronic illness   Diagnostics: "Pt meets criteria for severe MALNUTRITION in the context of chronic illness as evidenced by 15% wt loss x 1 month and intake of <75% x at least 1 month."  ASSESSMENT: PMHx significant for advanced dementia and DM. Admitted with increasing lethargy and poor oral intake. Work-up reveals severe dehydration.  INTERVENTION: Monitor magnesium, potassium, and phosphorus daily for at least 3 days, MD to replete as needed, as pt is at risk for refeeding syndrome given severe malnutrition. Please consult RD if aggressive nutrition interventions desired, such as initiation of nutrition support. Recommend Glucerna Shake po BID, each supplement provides 220 kcal and 10 grams of protein, between meals if/when diet advanced. RD to continue to follow nutrition care plan.  Thank You, Nevin Bloodgood, RN, BSN, CCDS Clinical Documentation Specialist:  [phone]   Cell=[phone] [place] Information Management

## 2013-11-19 NOTE — Progress Notes (Signed)
Utilization review completed.  

## 2013-11-19 NOTE — Evaluation (Signed)
Physical Therapy Evaluation Patient Details Name: [name] MRN: [id number] DOB: 1922/06/22 Today's Date: 11/19/2013 Time: 4696-2952 PT Time Calculation (min): 28 min  PT Assessment / Plan / Recommendation History of Present Illness  : [name] is a 79 y.o. female with known history of advanced dementia was brought to the ER after patient was found to be increasingly lethargic less responsive and not eating well. Patient has not been eating well for last 2 weeks and over the last 2-3 days has not taken her medications. In the ER patient was found to be lethargic and hypoxic with tachycardia. Labs reveal hypernatremia, with severe lactic acidosis and acute renal failure. CT head shows subdural hematoma which is subacute. As per patient's daughter patient had a fall last month and at that time she also hit her head. Patient was given 3 L normal saline bolus after which patient's lactic acid improved from 11-4. Patient became more responsive. Patient's tachycardia improved. Initially patient was started on broad-spectrum antibiotics for possible sepsis. Blood cultures have been sent. Patient will be admitted for further management. As per patient's daughter patient has not been eating well but otherwise has not had any nausea vomiting abdominal pain diarrhea or any chest pain or shortness of breath.   Clinical Impression  Pt continues to improve from admission, but per daughter is not back to baseline.  Pt presents with weakness and needs constant v/t cues to get participation.  Will see pt on acute to assist in keeping her mobile, but will defer management to her caregivers at home once discharged.    PT Assessment  Patient needs continued PT services    Follow Up Recommendations  No PT follow up;Other (comment) (managed by caregivers)    Does the patient have the potential to tolerate intense rehabilitation      Barriers to Discharge        Equipment Recommendations  None  recommended by PT    Recommendations for Other Services     Frequency Min 2X/week    Precautions / Restrictions Precautions Precautions: Fall   Pertinent Vitals/Pain       Mobility  Bed Mobility Bed Mobility: Supine to Sit;Sitting - Scoot to Edge of Bed Supine to Sit: 2: Max assist;HOB flat Sitting - Scoot to [place] of Bed: 2: Max assist Details for Bed Mobility Assistance: tc's to help initiate movement and significant assist Transfers Transfers: Heritage manager Transfers: 2: Max assist Details for Transfer Assistance: maximal assist given, but pt did assist 40-50% Ambulation/Gait Ambulation/Gait Assistance: Not tested (comment) Stairs: No Wheelchair Mobility Wheelchair Mobility: No    Exercises     PT Diagnosis: Generalized weakness  PT Problem List: Decreased strength;Decreased activity tolerance;Decreased mobility;Decreased coordination;Decreased cognition PT Treatment Interventions: Functional mobility training;Therapeutic activities;Balance training;Patient/family education     PT Goals(Current goals can be found in the care plan section) Acute Rehab PT Goals Patient Stated Goal: dtr wants her to move more to build strength PT Goal Formulation: With patient Time For Goal Achievement: 11/26/13 Potential to Achieve Goals: Fair  Visit Information  Last PT Received On: 11/19/13 Assistance Needed: +1 History of Present Illness: : [name] is a 79 y.o. female with known history of advanced dementia was brought to the ER after patient was found to be increasingly lethargic less responsive and not eating well. Patient has not been eating well for last 2 weeks and over the last 2-3 days has not taken her medications. In the ER patient  was found to be lethargic and hypoxic with tachycardia. Labs reveal hypernatremia, with severe lactic acidosis and acute renal failure. CT head shows subdural hematoma which is subacute. As per patient's daughter patient  had a fall last month and at that time she also hit her head. Patient was given 3 L normal saline bolus after which patient's lactic acid improved from 11-4. Patient became more responsive. Patient's tachycardia improved. Initially patient was started on broad-spectrum antibiotics for possible sepsis. Blood cultures have been sent. Patient will be admitted for further management. As per patient's daughter patient has not been eating well but otherwise has not had any nausea vomiting abdominal pain diarrhea or any chest pain or shortness of breath.        Prior Functioning  Home Living Family/patient expects to be discharged to:: Private residence Living Arrangements: Children Available Help at Discharge: Personal care attendant;Other (Comment) (plus dtr for 24 hour assist) Type of Home: House Home Access: Ramped entrance Home Layout: One level Prior Function Level of Independence: Needs assistance Gait / Transfers Assistance Needed: N/A ADL's / Homemaking Assistance Needed: dependent    Cognition  Cognition Arousal/Alertness: Awake/alert Behavior During Therapy: WFL for tasks assessed/performed;Flat affect Overall Cognitive Status: History of cognitive impairments - at baseline    Extremity/Trunk Assessment Upper Extremity Assessment Upper Extremity Assessment: RUE deficits/detail;LUE deficits/detail RUE Deficits / Details: generally weak bil stronger gross flexion than extension; shd limitations LUE Deficits / Details: generally weak; gross flexion stronger than gross extention, but no better than 3/5 Lower Extremity Assessment Lower Extremity Assessment: RLE deficits/detail;LLE deficits/detail;Difficult to assess due to impaired cognition RLE Deficits / Details: Bil grossly 3-/5 and stiff  R weaker than L LE LLE Deficits / Details: See R LE   Balance Balance Balance Assessed: Yes Static Sitting Balance Static Sitting - Balance Support: Left upper extremity supported;Right upper  extremity supported;Feet unsupported Static Sitting - Level of Assistance: 4: Min assist;Other (comment) (min guard once positioned at EOB) Static Sitting - Comment/# of Minutes: pt doesn't accept challenge to balance, but able to maintiain balance without challenge.  End of Session PT - End of Session Activity Tolerance: Patient tolerated treatment well Patient left: in chair;with call bell/phone within reach;with family/visitor present Nurse Communication: Mobility status  GP     Casmira Cramer, Eliseo Gum 11/19/2013, 4:00 PM 11/19/2013  [place] Bing, PT [phone]  (pager)

## 2013-11-19 NOTE — Progress Notes (Signed)
[facility] PROGRESS NOTE  [name] ZOX:[id number] DOB: 1922-01-16 DOA: 11/17/2013 PCP: Cain Saupe, MD  Assessment/Plan: 1. Acute encephalopathy - etiology most likely secondary to severe dehydration as patient's acute encephalopathy has improved with IV fluid rehydration. - Daughter reports that patient is able to eat without any difficulty and has no problems chewing food. Patient currently requesting food as such will liberalize her diet and place her on a general diet.  2. Subacute subdural hematoma - on-call neurosurgeon was consulted by ER physician. At this time neurosurgeon had requested no further workup.  - Given improvement in mentation we'll go ahead and place order for physical therapy evaluation.  3. Severe hypernatremia  - probably from severe dehydration.  - We'll plan on continuing maintenance IV fluids until patient eating and drinking well.  4. Severe lactic acidosis  - probably from severe dehydration. See #1.  - Currently trending down, patient nontoxic-appearing  5. Acute renal failure probably from dehydration  - see #1. - Serum creatinine improving with IV fluid rehydration. Most likely prerenal causes given dehydration -Will reassess serum creatinine next a.m.  6.  Mild leukocytosis  -Patient has been started on Levaquin. Not totally convinced patient has active infectious etiology currently. We'll continue regimen with low threshold to discontinue  7. History of atrial fibrillation  - Given recent severe dehydration and soft blood pressures we'll continue to hold metoprolol. Consider restarting should heart rate go above 110 and blood pressures permit.  8. History of borderline diabetes mellitus  -Will continue to closely follow CBGs as patient is placed on D5W for severe hypernatremia.   9.  History of dementia  - Given improvement in mentation we'll resume home medications   Code Status: DNR Family Communication: No family at bedside,  we'll plan on updating daughter next a.m. Disposition Plan: Given improvement in condition, and information located in history of present illness. I think it would be reasonable to consider patient for discharge home with daughter as she is planning on taking off of work and will be available immediately to help care for her mother. Currently patient has new caretaker at home who is not encourage enough oral fluid intake. Patient already has plans to take off of work and will be available immediately after discharge, per my discussion with patient's daughter   Consultants:  None  Procedures:  None  Antibiotics:  Currently on IV Levaquin   HPI/Subjective: Patient alert awake and responding to questions. Condition much improved from yesterday. Daughter room and explains that patient has a new caregiver at home with whom she suspects is not providing appropriate by mouth fluid encouragement. She states that patient was in a nursing home in the past and had a similar problem and as such had to take her mother out of the nursing home. With proper encouragement of by mouth intake patient reportedly does well.  Objective: Filed Vitals:   11/19/13 0537  BP: 107/75  Pulse: 80  Temp: 97.4 F (36.3 C)  Resp: 18    Intake/Output Summary (Last 24 hours) at 11/19/13 1058 Last data filed at 11/19/13 0539  Gross per 24 hour  Intake      0 ml  Output   1200 ml  Net  -1200 ml   Filed Weights   11/17/13 2359  Weight: 63.458 kg (139 lb 14.4 oz)    Exam:   General:  Patient hyper-somnolent, in no acute distress  Cardiovascular: Irregularly irregular, no murmurs  Respiratory: Clear to auscultation bilaterally, no  increased work of breathing  Abdomen: Soft, nondistended, nontender  Musculoskeletal: No cyanosis, no clubbing  Data Reviewed: Basic Metabolic Panel:  Recent Labs Lab 11/17/13 1512 11/18/13 0645 11/19/13 0830  NA 162* 158* 149*  K 3.9 3.0* 3.2*  CL 118* 124* 113*   CO2 10* 20 21  GLUCOSE 172* 133* 128*  BUN 49* 52* 34*  CREATININE 1.97* 1.84* 1.27*  CALCIUM 9.3 7.7* 8.5   Liver Function Tests:  Recent Labs Lab 11/17/13 1512 11/18/13 0645  AST 45* 70*  ALT 19 37*  ALKPHOS 62 48  BILITOT 0.9 0.3  PROT 8.0 6.4  ALBUMIN 2.8* 2.2*   No results found for this basename: LIPASE, AMYLASE,  in the last 168 hours No results found for this basename: AMMONIA,  in the last 168 hours CBC:  Recent Labs Lab 11/17/13 1512 11/18/13 0645 11/19/13 0830  WBC 13.0* 13.1* 7.4  NEUTROABS 10.0* 11.0*  --   HGB 12.3 9.7* 11.6*  HCT 39.2 30.2* 35.8*  MCV 97.8 95.0 94.0  PLT 168 132* 135*   Cardiac Enzymes:  Recent Labs Lab 11/17/13 1512  TROPONINI <0.30   BNP (last 3 results)  Recent Labs  11/17/13 1512  PROBNP 453.5*   CBG:  Recent Labs Lab 11/18/13 1637 11/18/13 2027 11/19/13 0036 11/19/13 0425 11/19/13 0744  GLUCAP 121* 142* 140* 157* 136*    Recent Results (from the past 240 hour(s))  URINE CULTURE     Status: None   Collection Time    11/17/13  5:37 PM      Result Value Range Status   Specimen Description URINE, CATHETERIZED   Final   Special Requests NONE   Final   Culture  Setup Time     Final   Value: 11/18/2013 01:11     Performed at [facility] Count     Final   Value: NO GROWTH     Performed at [facility]   Culture     Final   Value: NO GROWTH     Performed at [facility]   Report Status 11/19/2013 FINAL   Final     Studies: Ct Head Wo Contrast  11/17/2013   CLINICAL DATA:  Altered mental status  EXAM: CT HEAD WITHOUT CONTRAST  TECHNIQUE: Contiguous axial images were obtained from the base of the skull through the vertex without intravenous contrast. Study was obtained within 24 hr of patient's arrival at the emergency department.  COMPARISON:  Brain CT August 22, 2010 and brain MRI August 23, 2010  FINDINGS: There is moderate diffuse atrophy. Ventricles and proportion or  larger than sulci.  There is a subdural hematoma on the left which appear subacute. It has a maximum thickness of 6 mm. There is no other extra-axial fluid. There is no midline shift.  There is no mass or intra-axial hemorrhage. There is mild small vessel disease in the centra semiovale bilaterally. There is no acute infarct.  The bony calvarium appears intact. The mastoid air cells are clear. There is a right parietal scalp lipoma measuring 5.4 by 5.2 cm, stable.  IMPRESSION: Small subacute left subdural hematoma, not causing significant mass effect and no midline shift.  There is atrophy with questionable superimposed communicating hydrocephalus. There is mild small vessel disease. No intra-axial hemorrhage or acute appearing infarct.  Critical Value/emergent results were called by telephone at the time of interpretation on 11/17/2013 at 7:31 PM to Dr. Randa Spike, Romeo Apple , who verbally acknowledged these results.  Electronically Signed   By: Bretta Bang M.D.   On: 11/17/2013 19:31   Dg Chest Port 1 View  11/17/2013   CLINICAL DATA:  Altered mental status  EXAM: PORTABLE CHEST - 1 VIEW  COMPARISON:  May 21, 2013  FINDINGS: There is no edema or consolidation. Heart is upper normal in size with normal pulmonary vascularity. There is prominence of the thoracic aorta which appears stable. Prominence in the right peritracheal region is probably due to great vessel prominence. There is no appreciable adenopathy. No bone lesions.  IMPRESSION: Prominent and tortuous aorta. This finding may be due to chronic hypertension. Aneurysmal dilatation cannot be excluded on this study.  No edema or consolidation.   Electronically Signed   By: Bretta Bang M.D.   On: 11/17/2013 16:05    Scheduled Meds: . antiseptic oral rinse  15 mL Mouth Rinse q12n4p  . chlorhexidine  15 mL Mouth Rinse BID  . [START ON 11/20/2013] levofloxacin (LEVAQUIN) IV  500 mg Intravenous Q48H   Continuous Infusions:    Principal  Problem:   Acute encephalopathy Active Problems:   Alzheimer's disease   Hypernatremia   Lactic acidosis   Subdural hematoma    Time spent: > 35 minutes    Penny Pia  [facility] Pager [phone] If 7PM-7AM, please contact night-coverage at [email or web address], password [facility] 11/19/2013, 10:58 AM  LOS: 2 days

## 2013-11-20 LAB — BASIC METABOLIC PANEL
BUN: 19 mg/dL (ref 6–23)
CO2: 23 mEq/L (ref 19–32)
Calcium: 8.4 mg/dL (ref 8.4–10.5)
Chloride: 113 mEq/L — ABNORMAL HIGH (ref 96–112)
Creatinine, Ser: 1.02 mg/dL (ref 0.50–1.10)
GFR calc non Af Amer: 47 mL/min — ABNORMAL LOW (ref >90)
Glucose, Bld: 144 mg/dL — ABNORMAL HIGH (ref 70–99)
Sodium: 149 mEq/L — ABNORMAL HIGH (ref 137–147)

## 2013-11-20 LAB — GLUCOSE, CAPILLARY
Glucose-Capillary: 107 mg/dL — ABNORMAL HIGH (ref 70–99)
Glucose-Capillary: 121 mg/dL — ABNORMAL HIGH (ref 70–99)

## 2013-11-20 MED ORDER — POTASSIUM CHLORIDE CRYS ER 20 MEQ PO TBCR
40.0000 meq | EXTENDED_RELEASE_TABLET | Freq: Once | ORAL | Status: AC
  Administered 2013-11-20: 15:00:00 40 meq via ORAL
  Filled 2013-11-20: qty 2

## 2013-11-20 MED ORDER — LEVOFLOXACIN 750 MG PO TABS: 750.0000 mg | ORAL_TABLET | ORAL | Status: DC

## 2013-11-20 MED ORDER — LEVOFLOXACIN 750 MG PO TABS
750.0000 mg | ORAL_TABLET | ORAL | Status: DC
  Filled 2013-11-20: qty 1

## 2013-11-20 NOTE — Progress Notes (Addendum)
Nsg Discharge Note  Admit Date:  11/17/2013 Discharge date: 11/20/2013   [name] to be D/C'd Home per MD order.  AVS completed.  Copy for chart, and copy for patient signed, and dated. Patient/caregiver able to verbalize understanding.  Discharge Medication:   Medication List         acetaminophen-codeine 300-30 MG per tablet  Commonly known as:  TYLENOL #3  Take 1 tablet by mouth every 6 (six) hours as needed for moderate pain.     cholecalciferol 1000 UNITS tablet  Commonly known as:  VITAMIN D  Take 1,000 Units by mouth daily.     diclofenac sodium 1 % Gel  Commonly known as:  VOLTAREN  Apply 2 g topically 4 (four) times daily.     divalproex 125 MG DR tablet  Commonly known as:  DEPAKOTE  Take 125 mg by mouth 2 (two) times daily.     donepezil 10 MG tablet  Commonly known as:  ARICEPT  Take 10 mg by mouth at bedtime.     levofloxacin 750 MG tablet  Commonly known as:  LEVAQUIN  Take 1 tablet (750 mg total) by mouth every other day.  Start taking on:  11/21/2013     loratadine 10 MG tablet  Commonly known as:  CLARITIN  Take 10 mg by mouth daily.     LORazepam 0.5 MG tablet  Commonly known as:  ATIVAN  Take 0.5 mg by mouth daily as needed for anxiety.     metoprolol tartrate 25 MG tablet  Commonly known as:  LOPRESSOR  Take 12.5 mg by mouth 2 (two) times daily.     NAMENDA XR 28 MG Cp24  Generic drug:  Memantine HCl ER  Take 1 capsule by mouth daily.     pantoprazole sodium 40 mg/20 mL Pack  Commonly known as:  PROTONIX  Take 20 mLs (40 mg total) by mouth daily.     risperiDONE 0.5 MG tablet  Commonly known as:  RISPERDAL  Take 0.5 mg by mouth 2 (two) times daily.     traMADol 50 MG tablet  Commonly known as:  ULTRAM  Take 50 mg by mouth 2 (two) times daily as needed for pain.        Discharge Assessment: Filed Vitals:   11/20/13 0446  BP: 129/82  Pulse: 80  Temp: 98.4 F (36.9 C)  Resp: 18   Skin clean, dry and wounds to sacrum area  stage 2, noted. Daughter said "I'll take care of it when I get home". Small breakdowns, remain unchanged, no signs of it worsening. Also spoke with daughter about repositioning to take pressure off her sacrum area.  Foley D/C'd around noon. Bladder scan complete for 40 to 50 ml. DO Notified and order to send home received. IV catheter discontinued intact. Site without signs and symptoms of complications - no redness or edema noted at insertion site, patient denies c/o pain - only slight tenderness at site.  Dressing with slight pressure applied.  D/c Instructions-Education: Discharge instructions given to patient/family with verbalized understanding. D/c education completed with patient/family including follow up instructions, medication list, d/c activities limitations if indicated, with other d/c instructions as indicated by MD - patient able to verbalize understanding, all questions fully answered. Patient instructed to return to ED, call 911, or call MD for any changes in condition.  Patient escorted via WC, and D/C home via private auto.  Kern Reap, RN 11/20/2013 4:16 PM

## 2013-11-20 NOTE — Discharge Summary (Signed)
Physician Discharge Summary  [name] ZOX:[id number] DOB: 1922/11/12 DOA: 11/17/2013  PCP: Cain Saupe, MD  Admit date: 11/17/2013 Discharge date: 11/20/2013  Time spent: 35 minutes  Recommendations for Outpatient Follow-up:  Patient will be discharged to home. Patient does have around-the-clock caregivers. She should follow up with her primary care physician within one week of discharge, and discuss possible polypharmacy. Patient should also have her lab work that in one week of discharge particularly a BMP.  Discharge Diagnoses:  Principal Problem:   Acute encephalopathy, resolved Active Problems:   Alzheimer's disease   Hypernatremia, improving   Lactic acidosis   Subdural hematoma   Severe malnutrition, secondary to altered mental status and poor oral intake, improving   Discharge Condition: Stable  Diet recommendation: heart healthy  Filed Weights   11/17/13 2359  Weight: 63.458 kg (139 lb 14.4 oz)    History of present illness:  [name] is a 79 y.o. female with known history of advanced dementia was brought to the ER after patient was found to be increasingly lethargic less responsive and not eating well. Patient has not been eating well for last 2 weeks and over the last 2-3 days has not taken her medications. In the ER patient was found to be lethargic and hypoxic with tachycardia. Labs reveal hypernatremia, with severe lactic acidosis and acute renal failure. CT head shows subdural hematoma which is subacute. As per patient's daughter patient had a fall last month and at that time she also hit her head. Patient was given 3 L normal saline bolus after which patient's lactic acid improved from 11-4. Patient became more responsive. Patient's tachycardia improved. Initially patient was started on broad-spectrum antibiotics for possible sepsis. Blood cultures have been sent. Patient will be admitted for further management. As per patient's daughter patient has not been  eating well but otherwise has not had any nausea vomiting abdominal pain diarrhea or any chest pain or shortness of breath.   Hospital Course:  This is an 79 year old female history of advanced dementia that was brought in by her daughter for having altered mental status as well as being more lethargic. Patient's daughter stated that she had not been eating or drinking well, and this has been going on for the past 2 weeks prior to admission. Patient was admitted for acute encephalopathy which is likely secondary to severe dehydration. Patient was also found to have severe hyponatremia upon admission. Her encephalopathy as well as hypernatremia improved with IV fluids. Patient has been able to consume and tolerate regular diet. Patient also was found to have a subacute subdural hematoma on CT scan of the head during admission. ER physician consulted neurosurgery who requested no further workup at this time. Physical therapy has been seeing the patient, however no home health needs are needed to to patient having 24-hour caregivers. Patient was also noted to have severe lactic acidosis, which is likely secondary to her severe dehydration. Patient did have severe malnutrition as well which was secondary to her poor oral intake.  Patient also had to have acute renal failure which is likely secondary dehydration, this has also improved with her IV fluids. Her creatinine is currently 1.02. Patient also was noted to have leukocytosis upon admission. Patient was started on Levaquin every 48 hours. She has one dose left. No particular infectious etiology has been found at this time. However , her leukocytosis has improved. Her chest x-ray showed no consolidation. Her UA showed 7-10 white blood cells with small leukocytes,  although her urine cultures showed no growth to date. Patient also found to have atrial fibrillation, her metoprolol was held due to her soft blood pressures however can be restarted as her blood  pressure has currently maintained in the normotensive range. Patient also had borderline diabetes mellitus. We did continue to monitor patient for her CBGs. She also has a history of dementia and patient was continued on her home medications. Patient's daughter is at bedside, she does think that her mother has improved and is ready to be discharged. Patient's daughter does state that they have round-the-clock care at home. Patient to follow up with her primary care physician within one week of discharge as well as have her BMP conducted. This was discussed with the patient's daughter she does understand agree.  Procedures: None  Consultations: None  Discharge Exam: Filed Vitals:   11/20/13 0446  BP: 129/82  Pulse: 80  Temp: 98.4 F (36.9 C)  Resp: 18     General: Well developed, well nourished, NAD, appears stated age  HEENT: NCAT, PERRLA, EOMI, Anicteic Sclera, mucous membranes moist. No pharyngeal erythema or exudates  Neck: Supple, no JVD, no masses  Cardiovascular: S1 S2 auscultated, irregularly irregular  Respiratory: Clear to auscultation bilaterally with equal chest rise  Abdomen: Soft, nontender, nondistended, + bowel sounds  Extremities: warm dry without cyanosis clubbing or edema  Neuro: Awake and alert, able to answer some questions.   Skin: Without rashes exudates or nodules  Discharge Instructions  Discharge Orders   Future Appointments Provider Department Dept Phone   01/21/2014 2:30 PM Alvan Dame, DPM [facility] [phone]   Future Orders Complete By Expires   Discharge instructions  As directed    Comments:     Patient will be discharged to home. Patient does have around-the-clock caregivers. She should follow up with her primary care physician within one week of discharge, and discuss possible polypharmacy. Patient should also have her lab work that in one week of discharge particularly a BMP.   Increase activity slowly  As  directed        Medication List         acetaminophen-codeine 300-30 MG per tablet  Commonly known as:  TYLENOL #3  Take 1 tablet by mouth every 6 (six) hours as needed for moderate pain.     cholecalciferol 1000 UNITS tablet  Commonly known as:  VITAMIN D  Take 1,000 Units by mouth daily.     diclofenac sodium 1 % Gel  Commonly known as:  VOLTAREN  Apply 2 g topically 4 (four) times daily.     divalproex 125 MG DR tablet  Commonly known as:  DEPAKOTE  Take 125 mg by mouth 2 (two) times daily.     donepezil 10 MG tablet  Commonly known as:  ARICEPT  Take 10 mg by mouth at bedtime.     levofloxacin 750 MG tablet  Commonly known as:  LEVAQUIN  Take 1 tablet (750 mg total) by mouth every other day.  Start taking on:  11/21/2013     loratadine 10 MG tablet  Commonly known as:  CLARITIN  Take 10 mg by mouth daily.     LORazepam 0.5 MG tablet  Commonly known as:  ATIVAN  Take 0.5 mg by mouth daily as needed for anxiety.     metoprolol tartrate 25 MG tablet  Commonly known as:  LOPRESSOR  Take 12.5 mg by mouth 2 (two) times daily.     NAMENDA  XR 28 MG Cp24  Generic drug:  Memantine HCl ER  Take 1 capsule by mouth daily.     pantoprazole sodium 40 mg/20 mL Pack  Commonly known as:  PROTONIX  Take 20 mLs (40 mg total) by mouth daily.     risperiDONE 0.5 MG tablet  Commonly known as:  RISPERDAL  Take 0.5 mg by mouth 2 (two) times daily.     traMADol 50 MG tablet  Commonly known as:  ULTRAM  Take 50 mg by mouth 2 (two) times daily as needed for pain.       No Known Allergies     Follow-up Information   Follow up with FULP, CAMMIE, MD. Schedule an appointment as soon as possible for a visit in 1 week.   Specialty:  Family Medicine   Contact information:   [address] [place], [place] [address] [place] [address] [phone]        The results of significant diagnostics from this hospitalization (including imaging, microbiology, ancillary and laboratory) are  listed below for reference.    Significant Diagnostic Studies: Ct Head Wo Contrast  11/17/2013   CLINICAL DATA:  Altered mental status  EXAM: CT HEAD WITHOUT CONTRAST  TECHNIQUE: Contiguous axial images were obtained from the base of the skull through the vertex without intravenous contrast. Study was obtained within 24 hr of patient's arrival at the emergency department.  COMPARISON:  Brain CT August 22, 2010 and brain MRI August 23, 2010  FINDINGS: There is moderate diffuse atrophy. Ventricles and proportion or larger than sulci.  There is a subdural hematoma on the left which appear subacute. It has a maximum thickness of 6 mm. There is no other extra-axial fluid. There is no midline shift.  There is no mass or intra-axial hemorrhage. There is mild small vessel disease in the centra semiovale bilaterally. There is no acute infarct.  The bony calvarium appears intact. The mastoid air cells are clear. There is a right parietal scalp lipoma measuring 5.4 by 5.2 cm, stable.  IMPRESSION: Small subacute left subdural hematoma, not causing significant mass effect and no midline shift.  There is atrophy with questionable superimposed communicating hydrocephalus. There is mild small vessel disease. No intra-axial hemorrhage or acute appearing infarct.  Critical Value/emergent results were called by telephone at the time of interpretation on 11/17/2013 at 7:31 PM to Dr. Randa Spike, Romeo Apple , who verbally acknowledged these results.   Electronically Signed   By: Bretta Bang M.D.   On: 11/17/2013 19:31   Dg Chest Port 1 View  11/17/2013   CLINICAL DATA:  Altered mental status  EXAM: PORTABLE CHEST - 1 VIEW  COMPARISON:  May 21, 2013  FINDINGS: There is no edema or consolidation. Heart is upper normal in size with normal pulmonary vascularity. There is prominence of the thoracic aorta which appears stable. Prominence in the right peritracheal region is probably due to great vessel prominence. There is no  appreciable adenopathy. No bone lesions.  IMPRESSION: Prominent and tortuous aorta. This finding may be due to chronic hypertension. Aneurysmal dilatation cannot be excluded on this study.  No edema or consolidation.   Electronically Signed   By: Bretta Bang M.D.   On: 11/17/2013 16:05    Microbiology: Recent Results (from the past 240 hour(s))  URINE CULTURE     Status: None   Collection Time    11/17/13  5:37 PM      Result Value Range Status   Specimen Description URINE, CATHETERIZED   Final  Special Requests NONE   Final   Culture  Setup Time     Final   Value: 11/18/2013 01:11     Performed at [facility] Count     Final   Value: NO GROWTH     Performed at [facility]   Culture     Final   Value: NO GROWTH     Performed at [facility]   Report Status 11/19/2013 FINAL   Final     Labs: Basic Metabolic Panel:  Recent Labs Lab 11/17/13 1512 11/18/13 0645 11/19/13 0830 11/20/13 0500  NA 162* 158* 149* 149*  K 3.9 3.0* 3.2* 3.2*  CL 118* 124* 113* 113*  CO2 10* 20 21 23   GLUCOSE 172* 133* 128* 144*  BUN 49* 52* 34* 19  CREATININE 1.97* 1.84* 1.27* 1.02  CALCIUM 9.3 7.7* 8.5 8.4   Liver Function Tests:  Recent Labs Lab 11/17/13 1512 11/18/13 0645  AST 45* 70*  ALT 19 37*  ALKPHOS 62 48  BILITOT 0.9 0.3  PROT 8.0 6.4  ALBUMIN 2.8* 2.2*   No results found for this basename: LIPASE, AMYLASE,  in the last 168 hours No results found for this basename: AMMONIA,  in the last 168 hours CBC:  Recent Labs Lab 11/17/13 1512 11/18/13 0645 11/19/13 0830  WBC 13.0* 13.1* 7.4  NEUTROABS 10.0* 11.0*  --   HGB 12.3 9.7* 11.6*  HCT 39.2 30.2* 35.8*  MCV 97.8 95.0 94.0  PLT 168 132* 135*   Cardiac Enzymes:  Recent Labs Lab 11/17/13 1512  TROPONINI <0.30   BNP: BNP (last 3 results)  Recent Labs  11/17/13 1512  PROBNP 453.5*   CBG:  Recent Labs Lab 11/19/13 2010 11/20/13 0026 11/20/13 0410 11/20/13 0746  11/20/13 1159  GLUCAP 196* 121* 107* 109* 181*       Signed:  Devonte Migues  [facility] 11/20/2013, 12:25 PM

## 2013-12-04 ENCOUNTER — Ambulatory Visit: Payer: Medicare Other

## 2013-12-09 ENCOUNTER — Ambulatory Visit: Payer: Medicare Other

## 2014-01-21 ENCOUNTER — Ambulatory Visit: Payer: Medicare Other

## 2014-01-22 ENCOUNTER — Encounter: Payer: Self-pay | Admitting: Neurology

## 2014-01-24 ENCOUNTER — Institutional Professional Consult (permissible substitution): Payer: Medicare Other | Admitting: Neurology

## 2014-01-24 ENCOUNTER — Telehealth: Payer: Self-pay | Admitting: Neurology

## 2014-01-24 NOTE — Telephone Encounter (Signed)
This patient no showed for a new patient appointment today. 

## 2014-02-09 ENCOUNTER — Encounter (HOSPITAL_COMMUNITY): Payer: Self-pay | Admitting: Emergency Medicine

## 2014-02-09 ENCOUNTER — Emergency Department (HOSPITAL_COMMUNITY): Payer: Medicare Other

## 2014-02-09 ENCOUNTER — Emergency Department (HOSPITAL_COMMUNITY)
Admission: EM | Admit: 2014-02-09 | Discharge: 2014-02-09 | Disposition: A | Discharge status: Home / Self Care | Payer: Medicare Other | Attending: Emergency Medicine | Admitting: Emergency Medicine

## 2014-02-09 DIAGNOSIS — I10 Essential (primary) hypertension: Secondary | ICD-10-CM | POA: Insufficient documentation

## 2014-02-09 DIAGNOSIS — Z8669 Personal history of other diseases of the nervous system and sense organs: Secondary | ICD-10-CM | POA: Insufficient documentation

## 2014-02-09 DIAGNOSIS — E119 Type 2 diabetes mellitus without complications: Secondary | ICD-10-CM | POA: Insufficient documentation

## 2014-02-09 DIAGNOSIS — Z79899 Other long term (current) drug therapy: Secondary | ICD-10-CM | POA: Insufficient documentation

## 2014-02-09 DIAGNOSIS — Z8781 Personal history of (healed) traumatic fracture: Secondary | ICD-10-CM | POA: Insufficient documentation

## 2014-02-09 DIAGNOSIS — R55 Syncope and collapse: Secondary | ICD-10-CM | POA: Insufficient documentation

## 2014-02-09 DIAGNOSIS — Z8739 Personal history of other diseases of the musculoskeletal system and connective tissue: Secondary | ICD-10-CM | POA: Insufficient documentation

## 2014-02-09 DIAGNOSIS — Z862 Personal history of diseases of the blood and blood-forming organs and certain disorders involving the immune mechanism: Secondary | ICD-10-CM | POA: Insufficient documentation

## 2014-02-09 DIAGNOSIS — F0390 Unspecified dementia without behavioral disturbance: Secondary | ICD-10-CM | POA: Insufficient documentation

## 2014-02-09 DIAGNOSIS — R259 Unspecified abnormal involuntary movements: Secondary | ICD-10-CM | POA: Insufficient documentation

## 2014-02-09 LAB — URINE MICROSCOPIC-ADD ON

## 2014-02-09 LAB — URINALYSIS, ROUTINE W REFLEX MICROSCOPIC
Glucose, UA: NEGATIVE mg/dL
Hgb urine dipstick: NEGATIVE
Ketones, ur: 15 mg/dL — AB
Nitrite: NEGATIVE
Protein, ur: NEGATIVE mg/dL
Specific Gravity, Urine: 1.02 (ref 1.005–1.030)
Urobilinogen, UA: 1 mg/dL (ref 0.0–1.0)
pH: 7 (ref 5.0–8.0)

## 2014-02-09 LAB — CBC
HCT: 34.3 % — ABNORMAL LOW (ref 36.0–46.0)
HEMOGLOBIN: 11.4 g/dL — AB (ref 12.0–15.0)
MCH: 29.3 pg (ref 26.0–34.0)
MCHC: 33.2 g/dL (ref 30.0–36.0)
MCV: 88.2 fL (ref 78.0–100.0)
PLATELETS: 330 10*3/uL (ref 150–400)
RBC: 3.89 MIL/uL (ref 3.87–5.11)
RDW: 17.6 % — ABNORMAL HIGH (ref 11.5–15.5)
WBC: 8.5 10*3/uL (ref 4.0–10.5)

## 2014-02-09 LAB — BASIC METABOLIC PANEL
BUN: 16 mg/dL (ref 6–23)
CHLORIDE: 101 meq/L (ref 96–112)
CO2: 25 meq/L (ref 19–32)
Calcium: 10 mg/dL (ref 8.4–10.5)
Creatinine, Ser: 0.59 mg/dL (ref 0.50–1.10)
GFR calc Af Amer: >90 mL/min (ref >90)
GFR calc non Af Amer: 79 mL/min — ABNORMAL LOW (ref >90)
GLUCOSE: 127 mg/dL — AB (ref 70–99)
POTASSIUM: 3.9 meq/L (ref 3.7–5.3)
SODIUM: 142 meq/L (ref 137–147)

## 2014-02-09 LAB — VALPROIC ACID LEVEL: Valproic Acid Lvl: <10 ug/mL — ABNORMAL LOW (ref 50.0–100.0)

## 2014-02-09 MED ORDER — METOPROLOL TARTRATE 1 MG/ML IV SOLN
5.0000 mg | Freq: Once | INTRAVENOUS | Status: AC
  Administered 2014-02-09: 5 mg via INTRAVENOUS
  Filled 2014-02-09: qty 5

## 2014-02-09 NOTE — ED Notes (Signed)
Per EMS, patient has a history of dementia and Alzheimer's Disease. Daughter called EMS this morning after patient was acting lethargic at breakfast. Daughter reports weakness and lethargy that is new for the patient. Upon arrival, blood pressure was 84/52. EMS gave 100 mL of NS. Patient has hx of Atrial fibrillation. Per EMS: CBG 126, 164/89, 97 on RA, 108-130 HR with Afib. Denies Nausea, Vomiting, Diarrhea.

## 2014-02-09 NOTE — ED Notes (Signed)
Dr. Campos at the bedside.  

## 2014-02-09 NOTE — ED Notes (Signed)
Vitals validated at this time were not accurate.

## 2014-02-09 NOTE — ED Provider Notes (Signed)
CSN: [id number]     Arrival date & time 02/09/14  0906 History   First MD Initiated Contact with Patient 02/09/14 [phone]     Chief Complaint  Patient presents with  . Altered Mental Status     HPI Level V caveat: Dementia  Patient was brought to the emergency department because they episode occurred while the patient was having breakfast.  Family reports the patient was eating breakfast and then her head went down and she had a tremor in her bilateral hands.  Initially the patient would not respond and she began to respond.  There was some confusion for several minutes and then the patient returned back to baseline.  The patient has a history of dementia and therefore cannot provide any additional history.  No known history of seizures.  She does have a history of atrial fibrillation for which is not on Coumadin.  Family does not know why she is not on Coumadin.  Patient was scheduled to see the neurologist as she's had several events similar to this over the past year.  She has not seen a neurologist this point.  Recent appointment scheduled for March 6 was canceled by the patient's family for unclear reasons.  Family reports the patient is returned to baseline mental status at this time.  Pleasant dementia as her baseline.  No reported recent illness.  No reported recent fever vomiting or diarrhea.  Her appetite has been good as of lately.   Past Medical History  Diagnosis Date  . A-fib   . Syncope due to orthostatic hypotension   . Dementia   . Osteoarthritis   . Lumbar compression fracture   . Hypertension   . Cataract   . Diabetes mellitus without complication   . H/O: iron deficiency anemia   . Impaired fasting glucose   . Vitamin D deficiency    History reviewed. No pertinent past surgical history. Family History  Problem Relation Age of Onset  . Diabetes Sister   . Cancer Other     grandson had Colon Cancer  . Hypertension Daughter    History  Substance Use Topics  .  Smoking status: Never Smoker   . Smokeless tobacco: Never Used  . Alcohol Use: No   OB History   Grav Para Term Preterm Abortions TAB SAB Ect Mult Living                 Review of Systems  Unable to perform ROS: Dementia      Allergies  Review of patient's allergies indicates no known allergies.  Home Medications   Current Outpatient Rx  Name  Route  Sig  Dispense  Refill  . acetaminophen-codeine (TYLENOL #3) 300-30 MG per tablet   Oral   Take 1 tablet by mouth every 6 (six) hours as needed for moderate pain.         . chlorhexidine (PERIDEX) 0.12 % solution   Mouth/Throat   Use as directed 15 mLs in the mouth or throat daily.         . divalproex (DEPAKOTE) 125 MG DR tablet   Oral   Take 125 mg by mouth every other day.          . donepezil (ARICEPT) 10 MG tablet   Oral   Take 10 mg by mouth at bedtime.         . Iron-Vitamins (GERITOL PO)   Oral   Take 15 mLs by mouth daily.         Marland Kitchen  loratadine (CLARITIN) 10 MG tablet   Oral   Take 10 mg by mouth daily.         Marland Kitchen LORazepam (ATIVAN) 0.5 MG tablet   Oral   Take 0.5 mg by mouth daily as needed for anxiety.         . Memantine HCl ER (NAMENDA XR) 28 MG CP24   Oral   Take 1 capsule by mouth daily.         . pantoprazole (PROTONIX) 40 MG tablet   Oral   Take 40 mg by mouth daily.          BP 126/74  Pulse 112  Resp 16  SpO2 98% Physical Exam  Nursing note and vitals reviewed. Constitutional: She appears well-developed and well-nourished. No distress.  HENT:  Head: Normocephalic and atraumatic.  Eyes: EOM are normal. Pupils are equal, round, and reactive to light.  Neck: Normal range of motion.  Cardiovascular: Normal rate, regular rhythm and normal heart sounds.   Pulmonary/Chest: Effort normal and breath sounds normal.  Abdominal: Soft. She exhibits no distension. There is no tenderness.  Musculoskeletal: Normal range of motion.  Neurological: She is alert.  5/5 strength in  major muscle groups of  bilateral upper and lower extremities. Speech normal. No facial asymetry. Oriented to person but not place.  Gait not tested  Skin: Skin is warm and dry.  Psychiatric: She has a normal mood and affect. Judgment normal.    ED Course  Procedures (including critical care time) Labs Review Labs Reviewed  VALPROIC ACID LEVEL - Abnormal; Notable for the following:    Valproic Acid Lvl <10.0 (*)    All other components within normal limits  CBC - Abnormal; Notable for the following:    Hemoglobin 11.4 (*)    HCT 34.3 (*)    RDW 17.6 (*)    All other components within normal limits  BASIC METABOLIC PANEL - Abnormal; Notable for the following:    Glucose, Bld 127 (*)    GFR calc non Af Amer 79 (*)    All other components within normal limits  URINALYSIS, ROUTINE W REFLEX MICROSCOPIC - Abnormal; Notable for the following:    Bilirubin Urine SMALL (*)    Ketones, ur 15 (*)    Leukocytes, UA SMALL (*)    All other components within normal limits  URINE MICROSCOPIC-ADD ON   Imaging Review Ct Head Wo Contrast  02/09/2014   CLINICAL DATA:  Lethargic  EXAM: CT HEAD WITHOUT CONTRAST  TECHNIQUE: Contiguous axial images were obtained from the base of the skull through the vertex without intravenous contrast.  COMPARISON:  None.  FINDINGS: Global atrophy. Chronic ischemic changes. No mass effect, midline shift, or acute intracranial hemorrhage.  IMPRESSION: No acute intracranial pathology.   Electronically Signed   By: Maryclare Bean M.D.   On: 02/09/2014 11:28  I personally reviewed the imaging tests through PACS system I reviewed available ER/hospitalization records through the EMR    EKG Interpretation   Date/Time:  Sunday February 09 2014 09:16:00 EDT Ventricular Rate:  117 PR Interval:  146 QRS Duration: 126 QT Interval:  348 QTC Calculation: 485 R Axis:   29 Text Interpretation:  Sinus tachycardia Paired ventricular premature  complexes Right bundle branch block  Probable posterior infarct, acute  Baseline wander in lead(s) I II aVR No significant change was found  Confirmed by Carols Clemence  MD, Alexanderia Gorby ([id number]) on 02/09/2014 11:26:26 AM      MDM   Final  diagnoses:  None    Patient is returned baseline mental status at this time.  Symptoms may represent seizure versus syncope.  My suspicion for seizure is higher given her bilateral arm shaking.  Outpatient neurology followup.  Patient is on Depakote but for mood stabilization not for seizures.    Lyanne CoKevin M Bolton Canupp, MD 02/09/14 [phone]

## 2014-02-09 NOTE — ED Notes (Signed)
Placed call to [facility] for transportation back home 

## 2014-02-09 NOTE — ED Notes (Signed)
Lindsay at the bedside. Patient given something to drink per MD approval. [facility] called for transport. Address verified.

## 2014-02-09 NOTE — ED Notes (Signed)
Phlebotomy at the bedside  

## 2014-04-02 ENCOUNTER — Ambulatory Visit (INDEPENDENT_AMBULATORY_CARE_PROVIDER_SITE_OTHER): Payer: Medicare Other | Admitting: Neurology

## 2014-04-02 ENCOUNTER — Encounter: Payer: Self-pay | Admitting: Neurology

## 2014-04-02 VITALS — BP 98/70 | HR 58

## 2014-04-02 DIAGNOSIS — G40209 Localization-related (focal) (partial) symptomatic epilepsy and epileptic syndromes with complex partial seizures, not intractable, without status epilepticus: Secondary | ICD-10-CM | POA: Insufficient documentation

## 2014-04-02 DIAGNOSIS — G309 Alzheimer's disease, unspecified: Principal | ICD-10-CM

## 2014-04-02 DIAGNOSIS — F0280 Dementia in other diseases classified elsewhere without behavioral disturbance: Secondary | ICD-10-CM

## 2014-04-02 HISTORY — DX: Localization-related (focal) (partial) symptomatic epilepsy and epileptic syndromes with complex partial seizures, not intractable, without status epilepticus: G40.209

## 2014-04-02 MED ORDER — VALPROIC ACID 250 MG/5ML PO SYRP: 500.0000 mg | ORAL_SOLUTION | Freq: Two times a day (BID) | ORAL | Status: AC

## 2014-04-02 NOTE — Patient Instructions (Addendum)
Reduce the aricept to 5 mg daily for 2 weeks, then stop.   Epilepsy Epilepsy is a disorder in which a person has repeated seizures over time. A seizure is a release of abnormal electrical activity in the brain. Seizures can cause a change in attention, behavior, or the ability to remain awake and alert (altered mental status). Seizures often involve uncontrollable shaking (convulsions).  Most people with epilepsy lead normal lives. However, people with epilepsy are at an increased risk of falls, accidents, and injuries. Therefore, it is important to begin treatment right away. CAUSES  Epilepsy has many possible causes. Anything that disturbs the normal pattern of brain cell activity can lead to seizures. This may include:   Head injury.  Birth trauma.  High fever as a child.  Stroke.  Bleeding into or around the brain.  Certain drugs.  Prolonged low oxygen, such as what occurs after CPR efforts.  Abnormal brain development.  Certain illnesses, such as meningitis, encephalitis (brain infection), malaria, and other infections.  An imbalance of nerve signaling chemicals (neurotransmitters).  SIGNS AND SYMPTOMS  The symptoms of a seizure can vary greatly from one person to another. Right before a seizure, you may have a warning (aura) that a seizure is about to occur. An aura may include the following symptoms:  Fear or anxiety.  Nausea.  Feeling like the room is spinning (vertigo).  Vision changes, such as seeing flashing lights or spots. Common symptoms during a seizure include:  Abnormal sensations, such as an abnormal smell or a bitter taste in the mouth.   Sudden, general body stiffness.   Convulsions that involve rhythmic jerking of the face, arm, or leg on one or both sides.   Sudden change in consciousness.   Appearing to be awake but not responding.   Appearing to be asleep but cannot be awakened.   Grimacing, chewing, lip smacking, drooling, tongue  biting, or loss of bowel or bladder control. After a seizure, you may feel sleepy for a while. DIAGNOSIS  Your health care provider will ask about your symptoms and take a medical history. Descriptions from any witnesses to your seizures will be very helpful in the diagnosis. A physical exam, including a detailed neurological exam, is necessary. Various tests may be done, such as:   An electroencephalogram (EEG). This is a painless test of your brain waves. In this test, a diagram is created of your brain waves. These diagrams can be interpreted by a specialist.  An MRI of the brain.   A CT scan of the brain.   A spinal tap (lumbar puncture, LP).  Blood tests to check for signs of infection or abnormal blood chemistry. TREATMENT  There is no cure for epilepsy, but it is generally treatable. Once epilepsy is diagnosed, it is important to begin treatment as soon as possible. For most people with epilepsy, seizures can be controlled with medicines. The following may also be used:  A pacemaker for the brain (vagus nerve stimulator) can be used for people with seizures that are not well controlled by medicine.  Surgery on the brain. For some people, epilepsy eventually goes away. HOME CARE INSTRUCTIONS   Follow your health care provider's recommendations on driving and safety in normal activities.  Get enough rest. Lack of sleep can cause seizures.  Only take over-the-counter or prescription medicines as directed by your health care provider. Take any prescribed medicine exactly as directed.  Avoid any known triggers of your seizures.  Keep a seizure  diary. Record what you recall about any seizure, especially any possible trigger.   Make sure the people you live and work with know that you are prone to seizures. They should receive instructions on how to help you. In general, a witness to a seizure should:   Cushion your head and body.   Turn you on your side.   Avoid  unnecessarily restraining you.   Not place anything inside your mouth.   Call for emergency medical help if there is any question about what has occurred.   Follow up with your health care provider as directed. You may need regular blood tests to monitor the levels of your medicine.  SEEK MEDICAL CARE IF:   You develop signs of infection or other illness. This might increase the risk of a seizure.   You seem to be having more frequent seizures.   Your seizure pattern is changing.  SEEK IMMEDIATE MEDICAL CARE IF:   You have a seizure that does not stop after a few moments.   You have a seizure that causes any difficulty in breathing.   You have a seizure that results in a very severe headache.   You have a seizure that leaves you with the inability to speak or use a part of your body.  Document Released: 11/07/2005 Document Revised: 08/28/2013 Document Reviewed: 06/19/2013 [facility] Patient Information 2014 [place], [place].

## 2014-04-02 NOTE — Progress Notes (Signed)
Reason for visit: Seizures  [name] is a 80 y.o. female  History of present illness:  [name] is a 80 year old right-handed black female with a history of a progressive dementia consistent with Alzheimer's disease. The patient was last seen through this office in 2011. At that time, she was on Aricept and Namenda which she has continued. The patient was seen through the emergency room on 02/09/2014 with an episode of loss of consciousness, and trembling of the upper extremities. The patient has had a total of 3 such episodes. The patient will be somewhat unresponsive for up to 2 hours after the event, and then returned to her usual baseline. She has undergone a CT scan of the brain that did not show acute changes. She is sent to this office for an evaluation. She has been on very low-dose Depakote for behavior issues taking 125 mg twice daily.  Past Medical History  Diagnosis Date  . A-fib   . Syncope due to orthostatic hypotension   . Dementia   . Osteoarthritis   . Lumbar compression fracture   . Hypertension   . Cataract   . Diabetes mellitus without complication   . H/O: iron deficiency anemia   . Impaired fasting glucose   . Vitamin D deficiency   . Localization-related (focal) (partial) epilepsy and epileptic syndromes with complex partial seizures, without mention of intractable epilepsy 04/02/2014    History reviewed. No pertinent past surgical history.  Family History  Problem Relation Age of Onset  . Diabetes Sister   . Cancer Other     grandson had Colon Cancer  . Hypertension Daughter   . Dementia Daughter   . Diabetes Father   . Heart attack Brother     Social history:  reports that she has never smoked. She has never used smokeless tobacco. She reports that she does not drink alcohol or use illicit drugs.  Medications:  Current Outpatient Prescriptions on File Prior to Visit  Medication Sig Dispense Refill  . acetaminophen-codeine (TYLENOL #3)  300-30 MG per tablet Take 1 tablet by mouth every 6 (six) hours as needed for moderate pain.      . chlorhexidine (PERIDEX) 0.12 % solution Use as directed 15 mLs in the mouth or throat daily.      . Iron-Vitamins (GERITOL PO) Take 15 mLs by mouth daily.      Marland Kitchen. loratadine (CLARITIN) 10 MG tablet Take 10 mg by mouth daily.      Marland Kitchen. LORazepam (ATIVAN) 0.5 MG tablet Take 0.5 mg by mouth daily as needed for anxiety.      . pantoprazole (PROTONIX) 40 MG tablet Take 40 mg by mouth daily.       No current facility-administered medications on file prior to visit.     No Known Allergies  ROS:  Out of a complete 14 system review of symptoms, the patient complains only of the following symptoms, and all other reviewed systems are negative.  Incontinence of bladder Joint pain Runny nose Memory loss, confusion, weakness, passing out Anxiety, decreased energy Insomnia, snoring  Blood pressure 98/70, pulse 58, weight 0 lb (0 kg).  Physical Exam  General: The patient is alert and cooperative at the time of the examination.  Eyes: Pupils are equal, round, and reactive to light. Discs are flat bilaterally. Cataracts are present bilaterally.  Neck: The neck is supple, no carotid bruits are noted.  Respiratory: The respiratory examination is clear.  Cardiovascular: The cardiovascular examination reveals a  regular rate and rhythm, no obvious murmurs or rubs are noted.  Skin: Extremities are without significant edema.  Neurologic Exam  Mental status: The patient is alert and will cooperate some. The patient could not perform well enough to do Mini-Mental status examination today.  Cranial nerves: Facial symmetry is present. There is good sensation of the face to pinprick and soft touch bilaterally. The patient is minimally verbal.  Motor: The motor testing is difficult secondary to poor patient cooperation. Good motor tone is noted in the arms, some flexion contractures of the knees is  noted.  Sensory: The patient appears to react to pinprick sensation on all fours.  Coordination: The patient is able to touch the finger, cannot perform heel-to-shin on either side, apraxia is noted.  Gait and station: Gait was not tested, the patient is nonambulatory.  Reflexes: Deep tendon reflexes are symmetric and normal bilaterally. Toes are downgoing bilaterally.   Assessment/Plan:  1. Alzheimer's disease  2. Probable seizure  The patient is end-stage with Alzheimer disease, and she likely had a seizure event recently, with a total of 3 such episodes. The patient will be increased on her seizure medication to take Depakene 500 mg twice daily. She will followup in 4 months.  Marlan Palau. Keith Willis MD 04/02/2014 8:03 PM  [facility] [address] [place], [place] [id number]  Phone [phone] Fax [phone]

## 2014-05-23 ENCOUNTER — Inpatient Hospital Stay (HOSPITAL_COMMUNITY)
Admission: EM | Admit: 2014-05-23 | Discharge: 2014-06-21 | DRG: 640 | Disposition: E | Payer: Medicare Other | Attending: Internal Medicine | Admitting: Internal Medicine

## 2014-05-23 ENCOUNTER — Encounter (HOSPITAL_COMMUNITY): Payer: Self-pay | Admitting: Emergency Medicine

## 2014-05-23 DIAGNOSIS — E559 Vitamin D deficiency, unspecified: Secondary | ICD-10-CM | POA: Diagnosis present

## 2014-05-23 DIAGNOSIS — G309 Alzheimer's disease, unspecified: Secondary | ICD-10-CM | POA: Diagnosis present

## 2014-05-23 DIAGNOSIS — G9341 Metabolic encephalopathy: Secondary | ICD-10-CM | POA: Diagnosis present

## 2014-05-23 DIAGNOSIS — F0280 Dementia in other diseases classified elsewhere without behavioral disturbance: Secondary | ICD-10-CM | POA: Diagnosis present

## 2014-05-23 DIAGNOSIS — E41 Nutritional marasmus: Secondary | ICD-10-CM | POA: Diagnosis present

## 2014-05-23 DIAGNOSIS — M1990 Unspecified osteoarthritis, unspecified site: Secondary | ICD-10-CM | POA: Diagnosis present

## 2014-05-23 DIAGNOSIS — G40209 Localization-related (focal) (partial) symptomatic epilepsy and epileptic syndromes with complex partial seizures, not intractable, without status epilepticus: Secondary | ICD-10-CM | POA: Diagnosis present

## 2014-05-23 DIAGNOSIS — E119 Type 2 diabetes mellitus without complications: Secondary | ICD-10-CM | POA: Diagnosis present

## 2014-05-23 DIAGNOSIS — E870 Hyperosmolality and hypernatremia: Principal | ICD-10-CM | POA: Diagnosis present

## 2014-05-23 DIAGNOSIS — R627 Adult failure to thrive: Secondary | ICD-10-CM | POA: Diagnosis present

## 2014-05-23 DIAGNOSIS — Z8249 Family history of ischemic heart disease and other diseases of the circulatory system: Secondary | ICD-10-CM

## 2014-05-23 DIAGNOSIS — N179 Acute kidney failure, unspecified: Secondary | ICD-10-CM

## 2014-05-23 DIAGNOSIS — E878 Other disorders of electrolyte and fluid balance, not elsewhere classified: Secondary | ICD-10-CM | POA: Diagnosis present

## 2014-05-23 DIAGNOSIS — Z9181 History of falling: Secondary | ICD-10-CM

## 2014-05-23 DIAGNOSIS — G9340 Encephalopathy, unspecified: Secondary | ICD-10-CM | POA: Diagnosis present

## 2014-05-23 DIAGNOSIS — Z833 Family history of diabetes mellitus: Secondary | ICD-10-CM

## 2014-05-23 DIAGNOSIS — I4891 Unspecified atrial fibrillation: Secondary | ICD-10-CM | POA: Diagnosis present

## 2014-05-23 DIAGNOSIS — I10 Essential (primary) hypertension: Secondary | ICD-10-CM | POA: Diagnosis present

## 2014-05-23 DIAGNOSIS — E860 Dehydration: Secondary | ICD-10-CM | POA: Diagnosis present

## 2014-05-23 DIAGNOSIS — H269 Unspecified cataract: Secondary | ICD-10-CM | POA: Diagnosis present

## 2014-05-23 DIAGNOSIS — Z800 Family history of malignant neoplasm of digestive organs: Secondary | ICD-10-CM

## 2014-05-23 DIAGNOSIS — Z515 Encounter for palliative care: Secondary | ICD-10-CM

## 2014-05-23 DIAGNOSIS — D509 Iron deficiency anemia, unspecified: Secondary | ICD-10-CM | POA: Diagnosis present

## 2014-05-23 DIAGNOSIS — Z66 Do not resuscitate: Secondary | ICD-10-CM | POA: Diagnosis present

## 2014-05-23 HISTORY — DX: Dementia in other diseases classified elsewhere, unspecified severity, without behavioral disturbance, psychotic disturbance, mood disturbance, and anxiety: F02.80

## 2014-05-23 HISTORY — DX: Gastro-esophageal reflux disease without esophagitis: K21.9

## 2014-05-23 HISTORY — DX: Alzheimer's disease, unspecified: G30.9

## 2014-05-23 LAB — COMPREHENSIVE METABOLIC PANEL
ALT: 13 U/L (ref 0–35)
AST: 18 U/L (ref 0–37)
Albumin: 3.2 g/dL — ABNORMAL LOW (ref 3.5–5.2)
Alkaline Phosphatase: 62 U/L (ref 39–117)
BUN: 89 mg/dL — ABNORMAL HIGH (ref 6–23)
CO2: 24 mEq/L (ref 19–32)
Calcium: 9.7 mg/dL (ref 8.4–10.5)
Chloride: >130 mEq/L (ref 96–112)
Creatinine, Ser: 1.28 mg/dL — ABNORMAL HIGH (ref 0.50–1.10)
GFR calc Af Amer: 41 mL/min — ABNORMAL LOW (ref >90)
GFR calc non Af Amer: 36 mL/min — ABNORMAL LOW (ref >90)
Glucose, Bld: 136 mg/dL — ABNORMAL HIGH (ref 70–99)
Potassium: 4 mEq/L (ref 3.7–5.3)
Sodium: 174 mEq/L (ref 137–147)
Total Bilirubin: 0.5 mg/dL (ref 0.3–1.2)
Total Protein: 8.8 g/dL — ABNORMAL HIGH (ref 6.0–8.3)

## 2014-05-23 LAB — CBC
HCT: 45.4 % (ref 36.0–46.0)
Hemoglobin: 14.2 g/dL (ref 12.0–15.0)
MCH: 30.5 pg (ref 26.0–34.0)
MCHC: 31.3 g/dL (ref 30.0–36.0)
MCV: 97.6 fL (ref 78.0–100.0)
Platelets: 179 10*3/uL (ref 150–400)
RBC: 4.65 MIL/uL (ref 3.87–5.11)
RDW: 17.3 % — ABNORMAL HIGH (ref 11.5–15.5)
WBC: 8.8 10*3/uL (ref 4.0–10.5)

## 2014-05-23 MED ORDER — SODIUM CHLORIDE 0.9 % IV BOLUS (SEPSIS): 1000.0000 mL | Freq: Once | INTRAVENOUS | Status: DC

## 2014-05-23 MED ORDER — ONDANSETRON HCL 4 MG PO TABS: 4.0000 mg | ORAL_TABLET | Freq: Four times a day (QID) | ORAL | Status: DC | PRN

## 2014-05-23 MED ORDER — ATROPINE SULFATE 1 % OP SOLN
2.0000 [drp] | Freq: Four times a day (QID) | OPHTHALMIC | Status: DC
  Administered 2014-05-23 – 2014-05-25 (×5): 2 [drp] via SUBLINGUAL
  Filled 2014-05-23: qty 2

## 2014-05-23 MED ORDER — ONDANSETRON HCL 4 MG/2ML IJ SOLN
4.0000 mg | Freq: Four times a day (QID) | INTRAMUSCULAR | Status: DC | PRN
Start: 2014-05-23 — End: 2014-05-25

## 2014-05-23 MED ORDER — LORAZEPAM 2 MG/ML IJ SOLN
0.5000 mg | Freq: Two times a day (BID) | INTRAMUSCULAR | Status: DC
  Administered 2014-05-24: 0.5 mg via INTRAVENOUS
  Filled 2014-05-23: qty 1

## 2014-05-23 MED ORDER — ACETAMINOPHEN 650 MG RE SUPP: 650.0000 mg | Freq: Four times a day (QID) | RECTAL | Status: DC | PRN

## 2014-05-23 MED ORDER — SODIUM CHLORIDE 0.9 % IV BOLUS (SEPSIS)
1000.0000 mL | Freq: Once | INTRAVENOUS | Status: AC
  Administered 2014-05-23: 1000 mL via INTRAVENOUS

## 2014-05-23 MED ORDER — MORPHINE SULFATE 2 MG/ML IJ SOLN: 1.0000 mg | INTRAMUSCULAR | Status: DC | PRN

## 2014-05-23 MED ORDER — LORAZEPAM 2 MG/ML IJ SOLN: 1.0000 mg | INTRAMUSCULAR | Status: DC | PRN

## 2014-05-23 MED ORDER — ALBUTEROL SULFATE (2.5 MG/3ML) 0.083% IN NEBU: 2.5000 mg | INHALATION_SOLUTION | RESPIRATORY_TRACT | Status: DC | PRN

## 2014-05-23 MED ORDER — SODIUM CHLORIDE 0.9 % IV SOLN
2.0000 mg/h | INTRAVENOUS | Status: DC
  Administered 2014-05-23 – 2014-05-25 (×2): 2 mg/h via INTRAVENOUS
  Filled 2014-05-23 (×2): qty 10

## 2014-05-23 MED ORDER — SODIUM CHLORIDE 0.9 % IJ SOLN
3.0000 mL | INTRAMUSCULAR | Status: DC | PRN
Start: 2014-05-23 — End: 2014-05-25

## 2014-05-23 MED ORDER — SODIUM CHLORIDE 0.9 % IV SOLN
250.0000 mL | INTRAVENOUS | Status: DC | PRN
Start: 2014-05-23 — End: 2014-05-25

## 2014-05-23 MED ORDER — ACETAMINOPHEN 325 MG PO TABS: 650.0000 mg | ORAL_TABLET | Freq: Four times a day (QID) | ORAL | Status: DC | PRN

## 2014-05-23 MED ORDER — SODIUM CHLORIDE 0.9 % IJ SOLN: 3.0000 mL | Freq: Two times a day (BID) | INTRAMUSCULAR | Status: DC

## 2014-05-23 MED ORDER — MORPHINE SULFATE 2 MG/ML IJ SOLN
2.0000 mg | Freq: Once | INTRAMUSCULAR | Status: AC
  Administered 2014-05-23: 2 mg via INTRAVENOUS
  Filled 2014-05-23: qty 1

## 2014-05-23 MED ORDER — SCOPOLAMINE 1 MG/3DAYS TD PT72
1.0000 | MEDICATED_PATCH | TRANSDERMAL | Status: DC
  Administered 2014-05-23: 1.5 mg via TRANSDERMAL
  Filled 2014-05-23: qty 1

## 2014-05-23 NOTE — ED Notes (Signed)
Peri care completed- feces cleaned from perineum. In and out cath attempted by Gala Murdochtanisha NT and this RN without success. No urine return

## 2014-05-23 NOTE — Progress Notes (Addendum)
Linus MakoMinnie D Gumbs [id number] Admission Data: 06/14/2014 5:56 PM Attending Provider: Maretta BeesShanker M Ghimire, MD  [id number]:FULP, CAMMIE, MD Consults/ Treatment Team:    Linus MakoMinnie D [name] is a 80 y.o. female patient admitted from ED, will awake to voice, non-verbal, comfort care given,  DNR, VSS - Blood pressure 125/86, pulse 82, temperature 97.6 F (36.4 C), temperature source Oral, resp. rate 18, SpO2 99.00%.,     Allergies:  No Known Allergies   Past Medical History  Diagnosis Date  . A-fib   . Syncope due to orthostatic hypotension   . Dementia   . Osteoarthritis   . Lumbar compression fracture   . Hypertension   . Cataract   . Diabetes mellitus without complication   . H/O: iron deficiency anemia   . Impaired fasting glucose   . Vitamin D deficiency   . Localization-related (focal) (partial) epilepsy and epileptic syndromes with complex partial seizures, without mention of intractable epilepsy 04/02/2014     Information packet given to patient/family.  Admission INP armband ID verified with patient/family, and in place. SR up x 2, fall risk assessment complete with Patient and family verbalizing understanding of risks associated with falls.  Skin, clean-dry- intact without evidence of bruising, or skin tears.   No evidence of skin break down noted on exam.     Will cont to monitor and assist as needed.  Kern ReapBrumagin, Jaylun Fleener L, RN 06/19/2014 5:56 PM

## 2014-05-23 NOTE — H&P (Signed)
PATIENT DETAILS Name: Misty MakoMinnie D Sigler Age: 80 y.o. Sex: female Date of Birth: [birth year] Admit Date: 05/27/2014 VHQ:IONGPCP:FULP, CAMMIE, MD   CHIEF COMPLAINT:  Altered mental status, poor by mouth intake for the past week  HPI: [name] is a 80 y.o. female with a Past Medical History of severe Alzheimer's dementia, seizure disorder, history of severe malnutrition who presents today with the above noted complaint. Patient lives with her daughter, patient is significantly lethargic, and is not able to participate in the history taking process. History is obtained from patient's daughter Elease Hashimotoatricia at bedside. Apparently for the past 2 weeks, patient has had very poor appetite and has had hardly any oral intake. For the past one week, her oral intake has been nonexistent. Patient's family, called EMS, and as a result brought to the emergency room for further evaluation. Upon further evaluation here in the emergency room, patient was found to be profoundly dehydrated, with a serum sodium of 174 and a serum chloride of more than 130. I was asked to admit this patient for further evaluation and treatment. During my evaluation, patient's daughter Elease Hashimotoatricia was at bedside, we had a frank discussion about patient's overall prognosis and clinical course. Patient has had numerous similar admissions for encephalopathy, hypernatremia and failure to thrive syndrome. Family is very well aware of very poor prognosis. They have been contemplating reestablishing hospice care with their PCP. After long discussion, 2 options were presented to family-one of them was to continue with IV fluids and other supportive measures, the other was to transition to full comfort measures. Family at this time are choosing to transition to comfort care, I have subsequently discontinued all IV fluids, have transitioned to full comfort measures From the history obtained from the patient's family, there is no history of fever, cough, nausea,  vomiting diarrhea. There is a history of abdominal pain.   ALLERGIES:  No Known Allergies  PAST MEDICAL HISTORY: Past Medical History  Diagnosis Date  . A-fib   . Syncope due to orthostatic hypotension   . Dementia   . Osteoarthritis   . Lumbar compression fracture   . Hypertension   . Cataract   . Diabetes mellitus without complication   . H/O: iron deficiency anemia   . Impaired fasting glucose   . Vitamin D deficiency   . Localization-related (focal) (partial) epilepsy and epileptic syndromes with complex partial seizures, without mention of intractable epilepsy 04/02/2014    PAST SURGICAL HISTORY: History reviewed. No pertinent past surgical history.  MEDICATIONS AT HOME: Prior to Admission medications   Medication Sig Start Date End Date Taking? Authorizing Provider  acetaminophen-codeine (TYLENOL #3) 300-30 MG per tablet Take 1 tablet by mouth every 6 (six) hours as needed for moderate pain.   Yes Historical Provider, MD  chlorhexidine (PERIDEX) 0.12 % solution Use as directed 15 mLs in the mouth or throat daily.   Yes Historical Provider, MD  Iron-Vitamins (GERITOL PO) Take 15 mLs by mouth daily.   Yes Historical Provider, MD  loratadine (CLARITIN) 10 MG tablet Take 10 mg by mouth daily.   Yes Historical Provider, MD  LORazepam (ATIVAN) 0.5 MG tablet Take 0.5 mg by mouth daily as needed for anxiety.   Yes Historical Provider, MD  Memantine HCl ER (NAMENDA XR) 28 MG CP24 Take 28 mg by mouth daily.   Yes Historical Provider, MD  pantoprazole (PROTONIX) 40 MG tablet Take 40 mg by mouth daily.   Yes Historical Provider, MD  Valproic Acid (DEPAKENE) 250 MG/5ML SYRP syrup Take 10 mLs (500 mg total) by mouth 2 (two) times daily. 04/02/14  Yes York Spanielharles K Willis, MD    FAMILY HISTORY: Family History  Problem Relation Age of Onset  . Diabetes Sister   . Cancer Other     grandson had Colon Cancer  . Hypertension Daughter   . Dementia Daughter   . Diabetes Father   . Heart  attack Brother     SOCIAL HISTORY:  reports that she has never smoked. She has never used smokeless tobacco. She reports that she does not drink alcohol or use illicit drugs.  REVIEW OF SYSTEMS:  Not obtainable given patient's mental status   PHYSICAL EXAM: Blood pressure 116/75, pulse 75, temperature 98 F (36.7 C), temperature source Rectal, resp. rate 20, SpO2 99.00%.  General appearance: Lethargic, barely arousable, mumbles and groans at times. Nonverbal. HEENT: Atraumatic and Normocephalic, pupils equally reactive to light and accomodation Neck: supple, no JVD. No cervical lymphadenopathy.  Chest:Good air entry bilaterally, no added sounds - but some transmitted upper airway sounds. CVS: S1 S2 regular, no murmurs.  Abdomen: Bowel sounds present, Non tender and not distended with no gaurding, rigidity or rebound. Extremities: B/L Lower Ext shows no edema Neurology: Intermittently moves all 4 extremities-but with severe generalized weakness. Very poor/limited exam. Skin:No Rash Wounds:N/A  LABS ON ADMISSION:   Recent Labs  2014-09-01 1310  NA 174*  K 4.0  CL >130*  CO2 24  GLUCOSE 136*  BUN 89*  CREATININE 1.28*  CALCIUM 9.7    Recent Labs  2014-09-01 1310  AST 18  ALT 13  ALKPHOS 62  BILITOT 0.5  PROT 8.8*  ALBUMIN 3.2*   No results found for this basename: LIPASE, AMYLASE,  in the last 72 hours  Recent Labs  2014-09-01 1310  WBC 8.8  HGB 14.2  HCT 45.4  MCV 97.6  PLT 179   No results found for this basename: CKTOTAL, CKMB, CKMBINDEX, TROPONINI,  in the last 72 hours No results found for this basename: DDIMER,  in the last 72 hours No components found with this basename: POCBNP,    RADIOLOGIC STUDIES ON ADMISSION: No results found.    ASSESSMENT AND PLAN: Present on Admission:  . Severe dehydration . Hypernatremia . Acute encephalopathy . Alzheimer's disease . Hypertension . Localization-related (focal) (partial) epilepsy and epileptic  syndromes with complex partial seizures, without mention of intractable epilepsy  Patient presents with severe failure to thrive syndrome, with profound dehydration on exam she appears very dry. She has severe hypernatremia and hyperchloremia as well. She has a history of advanced dementia with seizure disorder, and was previously established with hospice. She is significantly lethargic, likely metabolic encephalopathy from underlying electrolytes abnormalities on top of Alzheimer's dementia. As noted in history of present illness, this M.D., had a long discussion with the patient's daughter-Patricia-at bedside, patient is a known DO NOT RESUSCITATE. After discussion, 2 options were presented to the patient's family, one to continue with IV fluid and supportive care, the other option was to transition to full comfort measures. At this time, family elects transitioning to full comfort measures. Patient appears to be slightly uncomfortable and is moaning and groaning, as a result we'll start her on IV morphine infusion, she does have a history of seizure disorder as a result we will start on scheduled low-dose lorazepam. Family aware, that her focus is on symptom management and comfort care, we will no longer be doing any further laboratory  monitoring. A Foley will be inserted for end of life care. I will consult a social worker to see if patient is eligible for residential hospice. Case management to be consulted to see if patient eligible for GIP.  Further plan will depend as patient's clinical course evolves and further radiologic and laboratory data become available. Patient will be monitored closely.  Above noted plan was discussed with family, they were in agreement.   DVT Prophylaxis: Not needed as on comfort measures  Code Status: DNR  Total time spent for admission equals 45 minutes.  [facility] Pager [phone]  If 7PM-7AM, please contact  night-coverage [email or web address] Password [email or web address] 05/27/2014, 3:20 PM  **Disclaimer: This note may have been dictated with voice recognition software. Similar sounding words can inadvertently be transcribed and this note may contain transcription errors which may not have been corrected upon publication of note.**

## 2014-05-23 NOTE — ED Provider Notes (Signed)
CSN: [id number]     Arrival date & time 06/05/2014  1228 History   First MD Initiated Contact with Patient 06/18/2014 1229     Chief Complaint  Patient presents with  . Altered Mental Status     (Consider location/radiation/quality/duration/timing/severity/associated sxs/prior Treatment) HPI  89yF brought in by family for evaluation of decreased mental status. Progressively worsening over last couple weeks. Refusing to eat or drink. No trauma that family is aware of. Not talking. Family reports was like this a few months ago when was severely dehydrated. Hx of complex partial seizure. No reported fever. No v/d.   Past Medical History  Diagnosis Date  . A-fib   . Syncope due to orthostatic hypotension   . Dementia   . Osteoarthritis   . Lumbar compression fracture   . Hypertension   . Cataract   . Diabetes mellitus without complication   . H/O: iron deficiency anemia   . Impaired fasting glucose   . Vitamin D deficiency   . Localization-related (focal) (partial) epilepsy and epileptic syndromes with complex partial seizures, without mention of intractable epilepsy 04/02/2014   History reviewed. No pertinent past surgical history. Family History  Problem Relation Age of Onset  . Diabetes Sister   . Cancer Other     grandson had Colon Cancer  . Hypertension Daughter   . Dementia Daughter   . Diabetes Father   . Heart attack Brother    History  Substance Use Topics  . Smoking status: Never Smoker   . Smokeless tobacco: Never Used  . Alcohol Use: No   OB History   Grav Para Term Preterm Abortions TAB SAB Ect Mult Living                 Review of Systems  Level 5 caveat because pt is nonverbal.  Allergies  Review of patient's allergies indicates no known allergies.  Home Medications   Prior to Admission medications   Medication Sig Start Date End Date Taking? Authorizing Provider  acetaminophen-codeine (TYLENOL #3) 300-30 MG per tablet Take 1 tablet by mouth every 6  (six) hours as needed for moderate pain.   Yes Historical Provider, MD  chlorhexidine (PERIDEX) 0.12 % solution Use as directed 15 mLs in the mouth or throat daily.   Yes Historical Provider, MD  Iron-Vitamins (GERITOL PO) Take 15 mLs by mouth daily.   Yes Historical Provider, MD  loratadine (CLARITIN) 10 MG tablet Take 10 mg by mouth daily.   Yes Historical Provider, MD  LORazepam (ATIVAN) 0.5 MG tablet Take 0.5 mg by mouth daily as needed for anxiety.   Yes Historical Provider, MD  Memantine HCl ER (NAMENDA XR) 28 MG CP24 Take 28 mg by mouth daily.   Yes Historical Provider, MD  pantoprazole (PROTONIX) 40 MG tablet Take 40 mg by mouth daily.   Yes Historical Provider, MD  Valproic Acid (DEPAKENE) 250 MG/5ML SYRP syrup Take 10 mLs (500 mg total) by mouth 2 (two) times daily. 04/02/14  Yes York Spanielharles K Willis, MD   BP 102/74  Pulse 85  Temp(Src) 98 F (36.7 C) (Rectal)  Resp 16  SpO2 100% Physical Exam  Nursing note and vitals reviewed. Constitutional:  Appears chronically ill   HENT:  Head: Normocephalic and atraumatic.  Dry mucus membranes  Eyes: Conjunctivae are normal. Right eye exhibits no discharge. Left eye exhibits no discharge.  Neck: Neck supple.  Cardiovascular: Regular rhythm and normal heart sounds.  Exam reveals no gallop and no friction rub.  No murmur heard. Mild tachycardia  Pulmonary/Chest: Breath sounds normal.  Shallow respirations. Lungs sounds clear.   Abdominal: Soft. She exhibits no distension. There is no tenderness.  Musculoskeletal: She exhibits no edema and no tenderness.  Neurological:  Laying in bed with eyes closed. Does not respond to verbal stimuli. Withdrawals all extremities to pain. Does not follow commands.   Skin: Skin is warm and dry.    ED Course  Procedures (including critical care time)  CRITICAL CARE Performed by: Andyn Sales TotRaeford Razoral critical care time: 30 minutes Critical care time was exclusive of separately billable procedures and  treating other patients. Critical care was necessary to treat or prevent imminent or life-threatening deterioration. Critical care was time spent personally by me on the following activities: development of treatment plan with patient and/or surrogate as well as nursing, discussions with consultants, evaluation of patient's response to treatment, examination of patient, obtaining history from patient or surrogate, ordering and performing treatments and interventions, ordering and review of laboratory studies, ordering and review of radiographic studies, pulse oximetry and re-evaluation of patient's condition.  Labs Review Labs Reviewed  CBC - Abnormal; Notable for the following:    RDW 17.3 (*)    All other components within normal limits  COMPREHENSIVE METABOLIC PANEL - Abnormal; Notable for the following:    Sodium 174 (*)    Chloride >130 (*)    Glucose, Bld 136 (*)    BUN 89 (*)    Creatinine, Ser 1.28 (*)    Total Protein 8.8 (*)    Albumin 3.2 (*)    GFR calc non Af Amer 36 (*)    GFR calc Af Amer 41 (*)    All other components within normal limits    Imaging Review No results found.   EKG Interpretation   Date/Time:  Friday May 23 2014 12:31:58 EDT Ventricular Rate:  100 PR Interval:  139 QRS Duration: 119 QT Interval:  402 QTC Calculation: 518 R Axis:   62 Text Interpretation:  Sinus tachycardia Incomplete right bundle branch  block Low voltage, precordial leads ED PHYSICIAN INTERPRETATION AVAILABLE  IN [facility] Confirmed by TEST, Record ([id number]) on 06/07/2014 8:36:14  AM      MDM   Final diagnoses:  Acute encephalopathy  Dehydration, severe  Hypernatremia  AKI (acute kidney injury)    89yF with decreased mental status and poor PO intake. W/u consistent with severe dehydration. Sounds like pretty similar situation this past December when presented in similar state and was found with AKI, hypernatremia, lactate of 11. Also had SDH then. Fairly recent  fall at that time though. Daughter reports no recent trauma, but with pt unable to give any history or participate in exam, will scan again. Pt DNR. Daughter reasonable in expectations, but pt seemed to recover pretty well after that episode and would like to see how she responds again.     Raeford RazorStephen Takeria Marquina, MD 06/02/14 1001

## 2014-05-23 NOTE — ED Notes (Signed)
Dr. Kohut at bedside 

## 2014-05-23 NOTE — ED Notes (Signed)
Pt from home, c/o altereted mental status x 3 weeks. Pt responds to name by opening eyes. NSD VSS

## 2014-05-23 NOTE — Progress Notes (Signed)
Attempt by Ginger Gleason and Delman CheadlePatti Moss to insert Foley catheter. Attempt times one for each nurse.

## 2014-05-24 NOTE — Progress Notes (Signed)
PATIENT DETAILS Name: Linus MakoMinnie D Crotwell Age: 80 y.o. Sex: female Date of Birth: [birth year] Admit Date: 06/15/2014 Admitting Physician Dewayne ShorterShanker Levora DredgeM Tiya Schrupp, MD JYN:WGNFPCP:FULP, CAMMIE, MD  Subjective: Uneventful night-appears comfortable  Assessment/Plan: Principal Problem:   Hypernatremia Active Problems:   Hypertension   Alzheimer's disease   Acute encephalopathy   Localization-related (focal) (partial) epilepsy and epileptic syndromes with complex partial seizures, without mention of intractable epilepsy   Severe dehydration  Patient presented with severe failure to thrive syndrome, with profound dehydration on exam she appears very dry. She has severe hypernatremia and hyperchloremia as well. She has a history of advanced dementia with seizure disorder, and was previously established with hospice. She is significantly lethargic, likely metabolic encephalopathy from underlying electrolytes abnormalities on top of Alzheimer's dementia. After d/w family,she was transitioned to comfort measures Continue with Morphine gtt, prn Morphine/Ativan. Await social work Programme researcher, broadcasting/film/videoeval/case management and palliative care input.  Disposition: Remain inpatient  DVT Prophylaxis: None needed-comfort measures  Code Status: DNR  Family Communication Daughter-Sylvia at bedside  Procedures:  None  CONSULTS:  Palliative care  MEDICATIONS: Scheduled Meds: . atropine  2 drop Sublingual QID  . LORazepam  0.5 mg Intravenous Q12H  . scopolamine  1 patch Transdermal Q72H  . sodium chloride  3 mL Intravenous Q12H   Continuous Infusions: . morphine 2 mg/hr (2014/01/11 1839)   PRN Meds:.sodium chloride, acetaminophen, acetaminophen, albuterol, LORazepam, morphine injection, ondansetron (ZOFRAN) IV, ondansetron, sodium chloride  Antibiotics: Anti-infectives   None       PHYSICAL EXAM: Vital signs in last 24 hours: Filed Vitals:   2014/01/11 1415 2014/01/11 1500 2014/01/11 1554 05/24/14 0612  BP: 102/74  116/75 125/86 90/63  Pulse: 85 75 82 104  Temp:   97.6 F (36.4 C) 97.8 F (36.6 C)  TempSrc:   Oral Axillary  Resp: 16 20 18 7   SpO2: 100% 99% 99% 90%    Weight change:  There were no vitals filed for this visit. There is no weight on file to calculate BMI.   Gen Exam: sedated, comfortable  Intake/Output from previous day:  Intake/Output Summary (Last 24 hours) at 05/24/14 0854 Last data filed at 2014/01/11 1900  Gross per 24 hour  Intake      0 ml  Output      0 ml  Net      0 ml     LAB RESULTS: CBC  Recent Labs Lab 2014/01/11 1310  WBC 8.8  HGB 14.2  HCT 45.4  PLT 179  MCV 97.6  MCH 30.5  MCHC 31.3  RDW 17.3*    Chemistries   Recent Labs Lab 2014/01/11 1310  NA 174*  K 4.0  CL >130*  CO2 24  GLUCOSE 136*  BUN 89*  CREATININE 1.28*  CALCIUM 9.7    CBG: No results found for this basename: GLUCAP,  in the last 168 hours  GFR The CrCl is unknown because both a height and weight (above a minimum accepted value) are required for this calculation.  Coagulation profile No results found for this basename: INR, PROTIME,  in the last 168 hours  Cardiac Enzymes No results found for this basename: CK, CKMB, TROPONINI, MYOGLOBIN,  in the last 168 hours  No components found with this basename: POCBNP,  No results found for this basename: DDIMER,  in the last 72 hours No results found for this basename: HGBA1C,  in the last 72 hours No results found for this basename: CHOL, HDL,  LDLCALC, TRIG, CHOLHDL, LDLDIRECT,  in the last 72 hours No results found for this basename: TSH, T4TOTAL, FREET3, T3FREE, THYROIDAB,  in the last 72 hours No results found for this basename: VITAMINB12, FOLATE, FERRITIN, TIBC, IRON, RETICCTPCT,  in the last 72 hours No results found for this basename: LIPASE, AMYLASE,  in the last 72 hours  Urine Studies No results found for this basename: UACOL, UAPR, USPG, UPH, UTP, UGL, UKET, UBIL, UHGB, UNIT, UROB, ULEU, UEPI, UWBC, URBC,  UBAC, CAST, CRYS, UCOM, BILUA,  in the last 72 hours  MICROBIOLOGY: No results found for this or any previous visit (from the past 240 hour(s)).  RADIOLOGY STUDIES/RESULTS: No results found.  Jeoffrey MassedGHIMIRE,Tynia Wiers, MD  [facility] Pager:[phone]  If 7PM-7AM, please contact night-coverage [email or web address] Password [email or web address] 05/24/2014, 8:54 AM   LOS: 1 day   **Disclaimer: This note may have been dictated with voice recognition software. Similar sounding words can inadvertently be transcribed and this note may contain transcription errors which may not have been corrected upon publication of note.**

## 2014-05-24 NOTE — Progress Notes (Signed)
Palliative consult received. Patient has clear goals of care and has been transitioned to full comfort care. Appreciate the excellent primary palliative care provided by her Hospitalist team- PMT will follow for any additional needs-she may be appropriate for GIP Hospice if she survives through the weekend, but I anticipate a hospital death soon. Please call us if there are urgent issues or if we can assist with a GIP Hospice transition.  Anderson MaltaElizabeth Floy Angert, DO Palliative Medicine [phone]

## 2014-05-24 NOTE — Plan of Care (Signed)
Problem: Phase I Progression Outcomes Goal: Voiding-avoid urinary catheter unless indicated Outcome: Not Met (add Reason) Orders for foley catheter.  Prior attempts unsuccessful.  Family refused.

## 2014-05-24 NOTE — Progress Notes (Signed)
Nutrition Brief Note  Pt identified via malnutrition screening tool.  Chart reviewed. Pt getting comfort care.  No further nutrition interventions warranted at this time.  Please re-consult as needed.   Charlott RakesHeather Starr Urias MS, RD, LDN [phone] Weekend/After Hours Pager

## 2014-06-21 NOTE — Plan of Care (Signed)
Problem: Phase III Progression Outcomes Goal: Pain controlled on oral analgesia Outcome: Not Met (add Reason) Pt on continuous morphine drip at 2ml/hr.      

## 2014-06-21 NOTE — Plan of Care (Signed)
Problem: Phase III Progression Outcomes Goal: Foley discontinued Outcome: Not Applicable Date Met:  43/14/27 Pt has no foley. Unsuccessful prior attempts.

## 2014-06-21 NOTE — Plan of Care (Signed)
Problem: Phase III Progression Outcomes Goal: Voiding independently Outcome: Not Applicable Date Met:  67/56/12 Pt is incontinent.

## 2014-06-21 NOTE — Plan of Care (Signed)
Problem: Discharge Progression Outcomes Goal: Tolerating diet Outcome: Not Met (add Reason) Pt on NPO. Comfort care.

## 2014-06-21 NOTE — Discharge Summary (Signed)
Death Summary  [name] [id number] DOB: 1922/06/13 DOA: 05/31/2014  PCP: Cain SaupeFULP, CAMMIE, MD PCP/Office notified: discharge summary will be forwarded to PCP  Admit date: 06/09/2014 Date of Death: 06/13/2014  Final Diagnoses:  Principal Problem:   Hypernatremia Active Problems:   Hypertension   Alzheimer's disease   Acute encephalopathy   Localization-related (focal) (partial) epilepsy and epileptic syndromes with complex partial seizures, without mention of intractable epilepsy   Severe dehydration    History of present illness:  Misty MakoMinnie D Stroupe is a 80 y.o. female with a Past Medical History of severe Alzheimer's dementia, seizure disorder, history of severe malnutrition who presented with worsening mental status. Patient has advanced dementia, has had very poor oral intake over 2 weeks prior to admission. She was found to be profoundly dehydrated, hypernatremic on admission. After discussion with family, patient was admitted for comfort care measures.  Hospital Course:  Patient presented with severe failure to thrive syndrome, with profound dehydration on exam she appeared very dry. She had severe hypernatremia and hyperchloremia as well. She had a history of advanced dementia with seizure disorder, and was previously established with hospice. She was significantly lethargic, likely metabolic encephalopathy from underlying electrolytes abnormalities on top of Alzheimer's dementia. After d/w family,she was transitioned to comfort measures.She was placed on Morphine gtt, prn Morphine/Ativan.She subsequently expired on 05/28/2014.   SignedJeoffrey Massed:  GHIMIRE,SHANKER  [facility] 06/09/2014, 11:39 AM

## 2014-06-21 NOTE — Progress Notes (Signed)
Wasted 15ml of morphine in the sink with Trimaine,RN.

## 2014-06-21 NOTE — Progress Notes (Signed)
Upon entering room pt was found not breathing and no pulse.  Also vertified by Ginger RN and Dr. Jerral RalphGhimire. Daughter at bedside.

## 2014-06-21 NOTE — Progress Notes (Signed)
PATIENT DETAILS Name: [name] Age: 80 y.o. Sex: female Date of Birth: [birth year] Admit Date: 06/13/2014 Admitting Physician Dewayne ShorterShanker Levora DredgeM Ghimire, MD [id number], CAMMIE, MD  Subjective: Uneventful night-appears comfortable. Daughter sleeping at bedside.  Assessment/Plan: Principal Problem:   Hypernatremia Active Problems:   Hypertension   Alzheimer's disease   Acute encephalopathy   Localization-related (focal) (partial) epilepsy and epileptic syndromes with complex partial seizures, without mention of intractable epilepsy   Severe dehydration  Patient presented with severe failure to thrive syndrome, with profound dehydration on exam she appears very dry. She has severe hypernatremia and hyperchloremia as well. She has a history of advanced dementia with seizure disorder, and was previously established with hospice. She is significantly lethargic, likely metabolic encephalopathy from underlying electrolytes abnormalities on top of Alzheimer's dementia. After d/w family,she was transitioned to comfort measures Continue with Morphine gtt, prn Morphine/Ativan. Await social work Programme researcher, broadcasting/film/videoeval/case management and palliative care input.  Disposition: Remain inpatient  DVT Prophylaxis: None needed-comfort measures  Code Status: DNR  Family Communication Daughter-Sylvia at bedside  Procedures:  None  CONSULTS:  Palliative care  MEDICATIONS: Scheduled Meds: . atropine  2 drop Sublingual QID  . LORazepam  0.5 mg Intravenous Q12H  . scopolamine  1 patch Transdermal Q72H  . sodium chloride  3 mL Intravenous Q12H   Continuous Infusions: . morphine 2 mg/hr (05/21/2014 0557)   PRN Meds:.sodium chloride, acetaminophen, acetaminophen, albuterol, LORazepam, morphine injection, ondansetron (ZOFRAN) IV, ondansetron, sodium chloride  Antibiotics: Anti-infectives   None       PHYSICAL EXAM: Vital signs in last 24 hours: Filed Vitals:   05/24/14 0612 05/24/14 1934 05/24/14  2225 06/19/2014 0256  BP: 90/63  90/50   Pulse: 104  118   Temp: 97.8 F (36.6 C)  102.3 F (39.1 C)   TempSrc: Axillary  Axillary   Resp: 7 14 18 10   SpO2: 90%  79%     Weight change:  There were no vitals filed for this visit. There is no weight on file to calculate BMI.   Gen Exam: sedated, comfortable  Intake/Output from previous day: No intake or output data in the 24 hours ending 06/17/2014 0835   LAB RESULTS: CBC  Recent Labs Lab 09-24-2014 1310  WBC 8.8  HGB 14.2  HCT 45.4  PLT 179  MCV 97.6  MCH 30.5  MCHC 31.3  RDW 17.3*    Chemistries   Recent Labs Lab 09-24-2014 1310  NA 174*  K 4.0  CL >130*  CO2 24  GLUCOSE 136*  BUN 89*  CREATININE 1.28*  CALCIUM 9.7    CBG: No results found for this basename: GLUCAP,  in the last 168 hours  GFR The CrCl is unknown because both a height and weight (above a minimum accepted value) are required for this calculation.  Coagulation profile No results found for this basename: INR, PROTIME,  in the last 168 hours  Cardiac Enzymes No results found for this basename: CK, CKMB, TROPONINI, MYOGLOBIN,  in the last 168 hours  No components found with this basename: POCBNP,  No results found for this basename: DDIMER,  in the last 72 hours No results found for this basename: HGBA1C,  in the last 72 hours No results found for this basename: CHOL, HDL, LDLCALC, TRIG, CHOLHDL, LDLDIRECT,  in the last 72 hours No results found for this basename: TSH, T4TOTAL, FREET3, T3FREE, THYROIDAB,  in the last 72 hours No results found for this basename:  VITAMINB12, FOLATE, FERRITIN, TIBC, IRON, RETICCTPCT,  in the last 72 hours No results found for this basename: LIPASE, AMYLASE,  in the last 72 hours  Urine Studies No results found for this basename: UACOL, UAPR, USPG, UPH, UTP, UGL, UKET, UBIL, UHGB, UNIT, UROB, ULEU, UEPI, UWBC, URBC, UBAC, CAST, CRYS, UCOM, BILUA,  in the last 72 hours  MICROBIOLOGY: No results found for this  or any previous visit (from the past 240 hour(s)).  RADIOLOGY STUDIES/RESULTS: No results found.  Jeoffrey MassedGHIMIRE,SHANKER, MD  [facility] Pager:[phone]  If 7PM-7AM, please contact night-coverage [email or web address] Password [email or web address] 05/21/2014, 8:35 AM   LOS: 2 days   **Disclaimer: This note may have been dictated with voice recognition software. Similar sounding words can inadvertently be transcribed and this note may contain transcription errors which may not have been corrected upon publication of note.**

## 2014-06-21 DEATH — deceased

## 2014-08-06 ENCOUNTER — Ambulatory Visit: Payer: Medicare Other | Admitting: Adult Health

## 2014-12-30 IMAGING — CR DG CHEST 1V PORT
1 series · 1 of 1 positions shown · non-contrast
Comparison: None.

CLINICAL DATA: Chest pain.

PORTABLE CHEST - 1 VIEW

[AP]
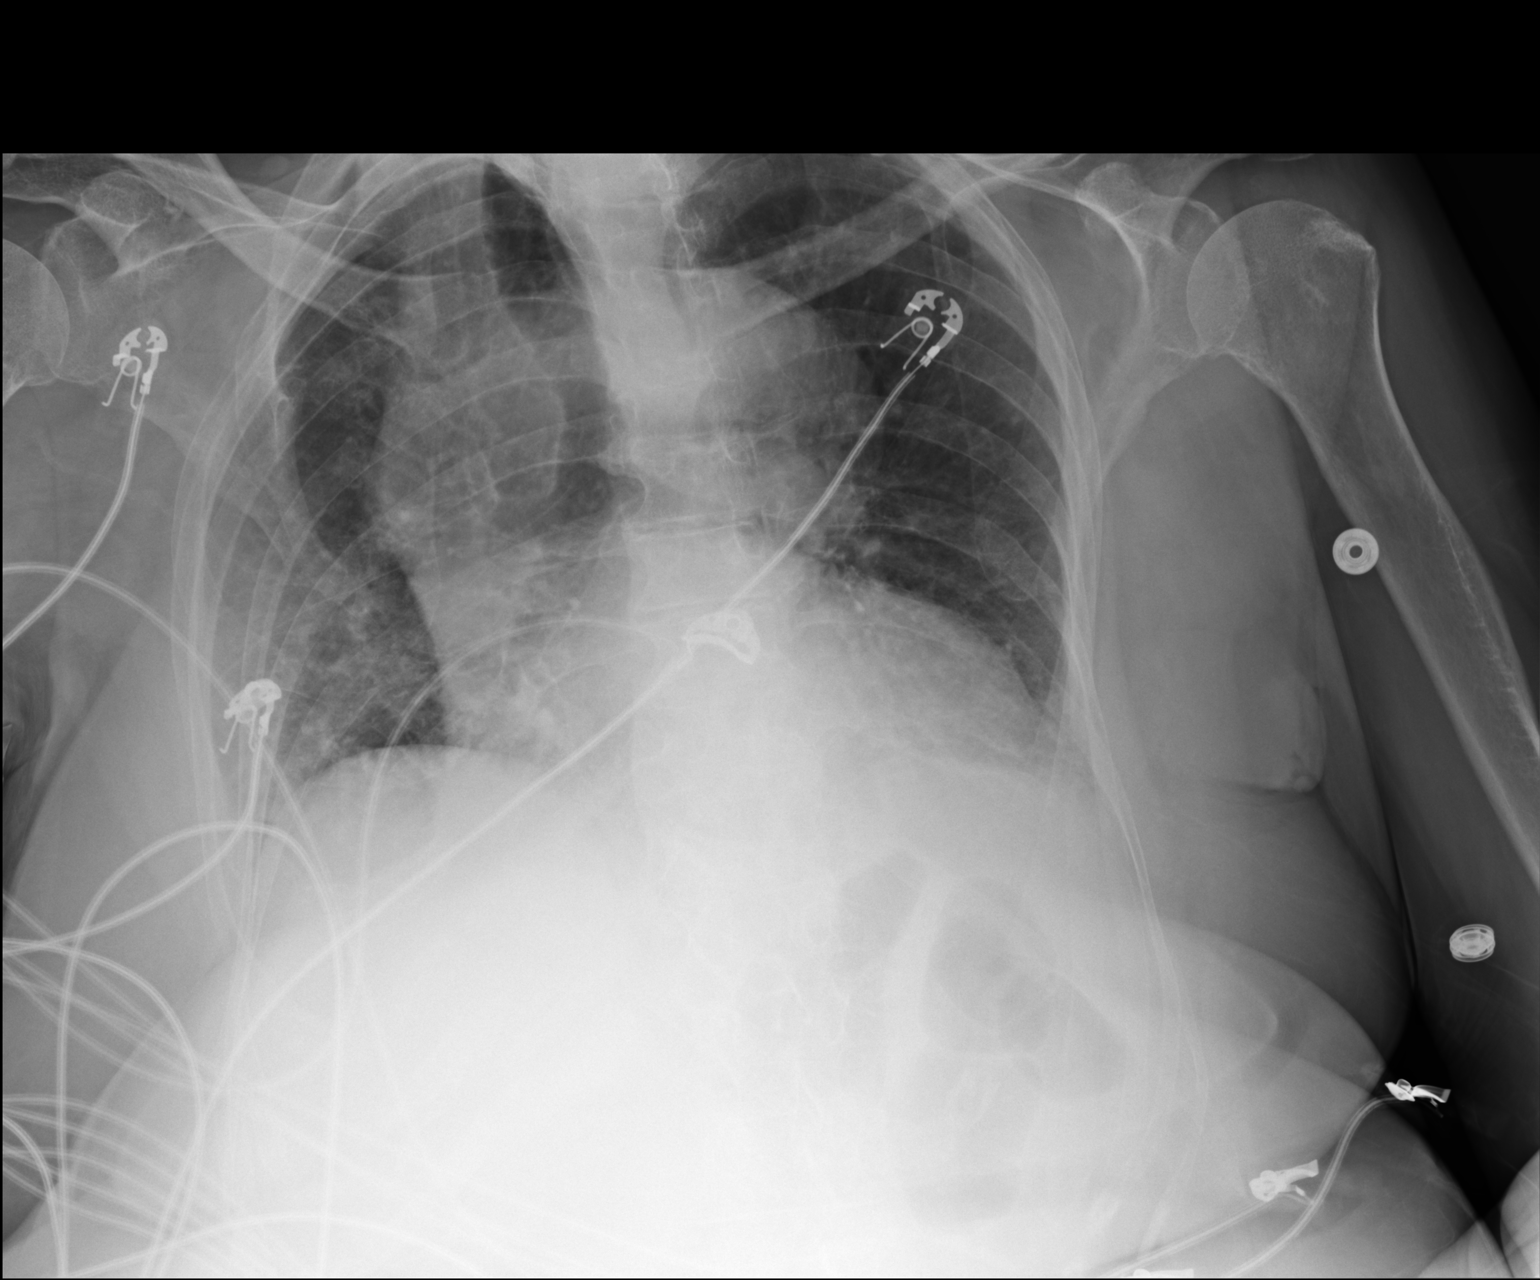

[1 of 1 positions shown; findings below may reference images not displayed]

FINDINGS: Low volume chest.  Patient is rotated to the right.
Tortuous thoracic aorta.  Basilar atelectasis.  No focal
consolidation.  Apical scarring is present.  Glenohumeral
osteoarthritis and AC joint osteoarthritis. Monitoring leads are
projected over the chest. Cardiopericardial silhouette appears
within normal limits allowing for low volumes and projection.
IMPRESSION: Low volume chest without gross acute cardiopulmonary disease.

## 2015-06-28 IMAGING — CR DG CHEST 1V PORT
1 series · 1 of 1 positions shown · non-contrast
Comparison: May 21, 2013

CLINICAL DATA: Altered mental status

EXAM:
PORTABLE CHEST - 1 VIEW

[AP]
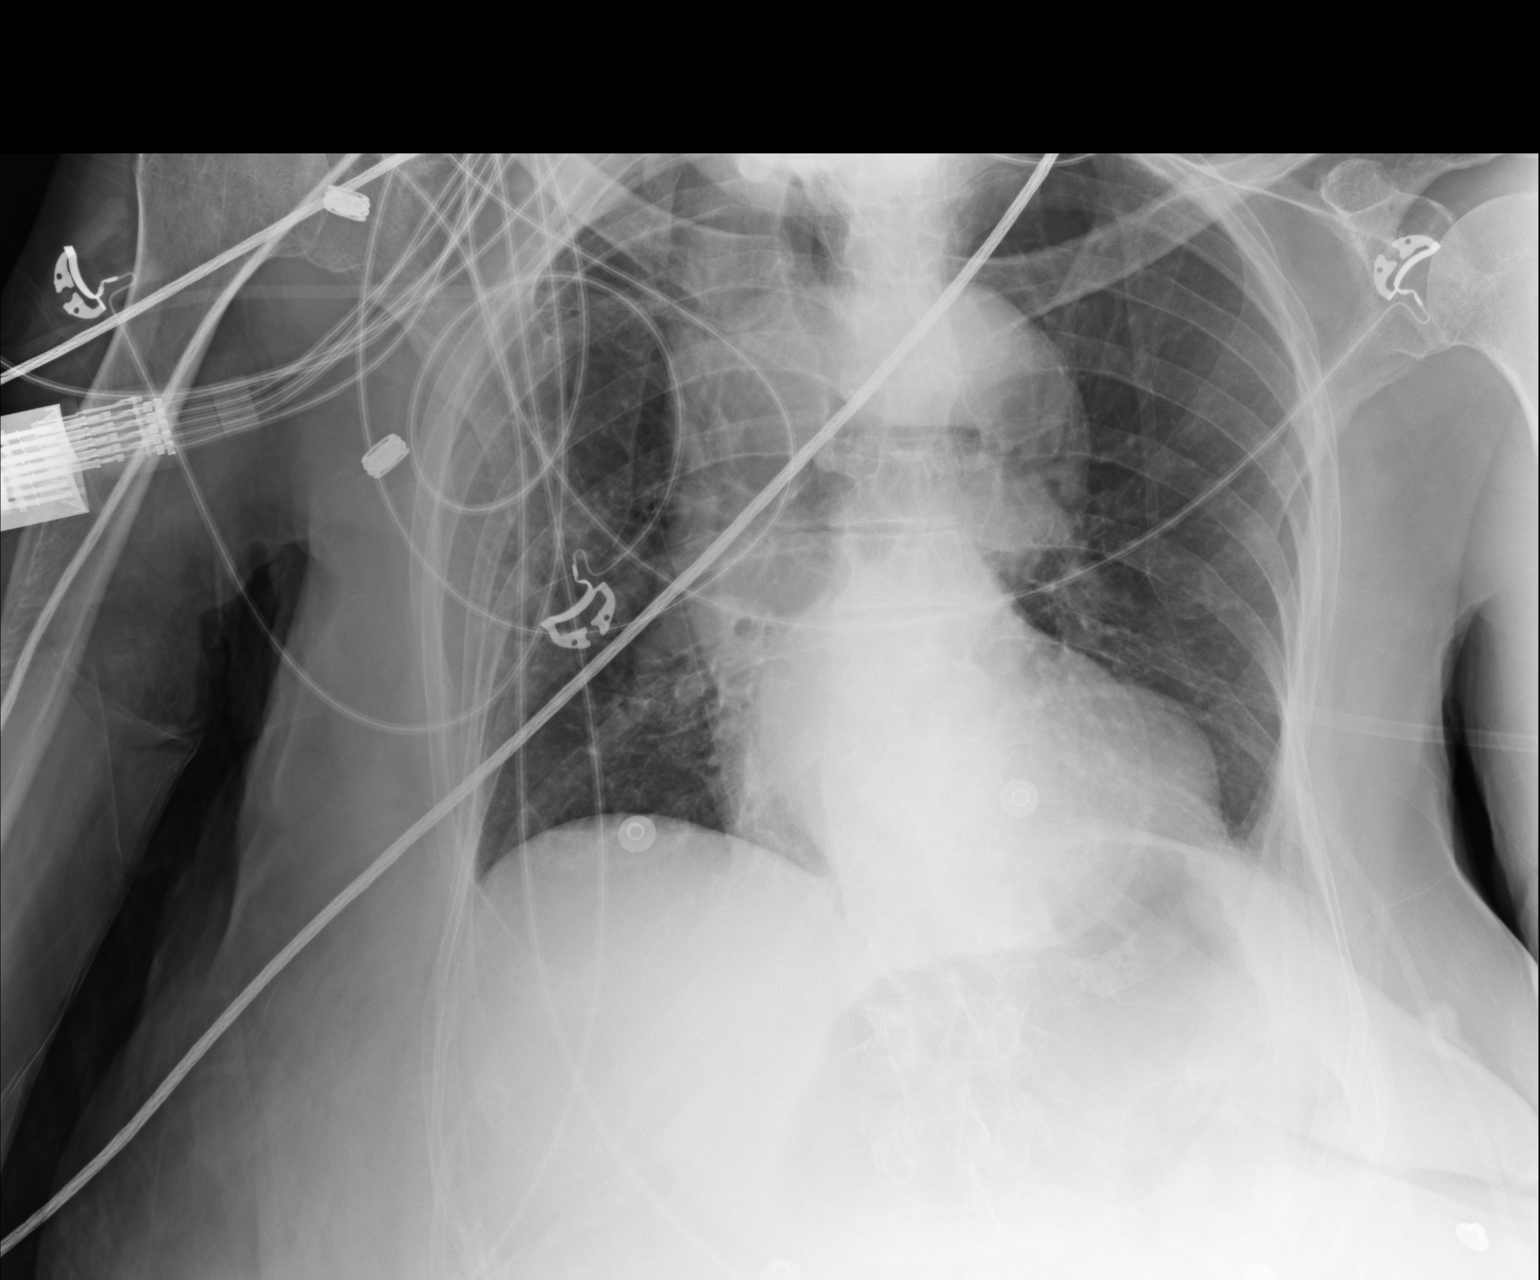

[1 of 1 positions shown; findings below may reference images not displayed]

FINDINGS: There is no edema or consolidation. Heart is upper normal in size
with normal pulmonary vascularity. There is prominence of the
thoracic aorta which appears stable. Prominence in the right
peritracheal region is probably due to great vessel prominence.
There is no appreciable adenopathy. No bone lesions.
IMPRESSION: Prominent and tortuous aorta. This finding may be due to chronic
hypertension. Aneurysmal dilatation cannot be excluded on this
study.

No edema or consolidation.

## 2015-06-28 IMAGING — CT CT HEAD W/O CM
2 series · 15 of 30 positions shown, 19 images · non-contrast
Comparison: Brain CT August 22, 2010 and brain MRI August 23, 2010

CLINICAL DATA: Altered mental status

EXAM:
CT HEAD WITHOUT CONTRAST
TECHNIQUE: Contiguous axial images were obtained from the base of the skull
through the vertex without intravenous contrast. Study was obtained
within 24 hr of patient's arrival at the emergency department.

[Series 4: head w/o · axial · non-contrast · 0.49mm/px · z∈[+39,+159]mm · 13 of 30 slices shown, 17 images]
[im 3/30  brain]
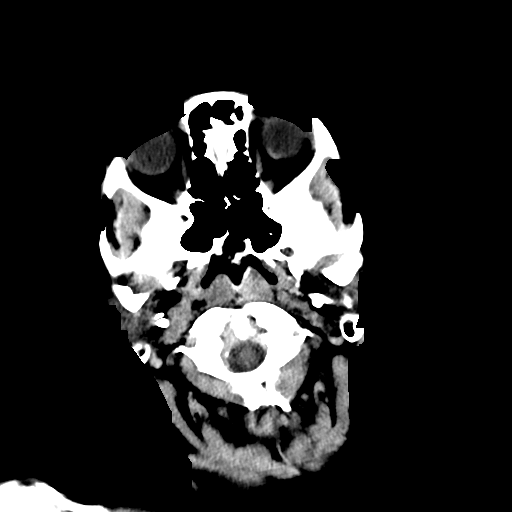
[im 3/30  bone]
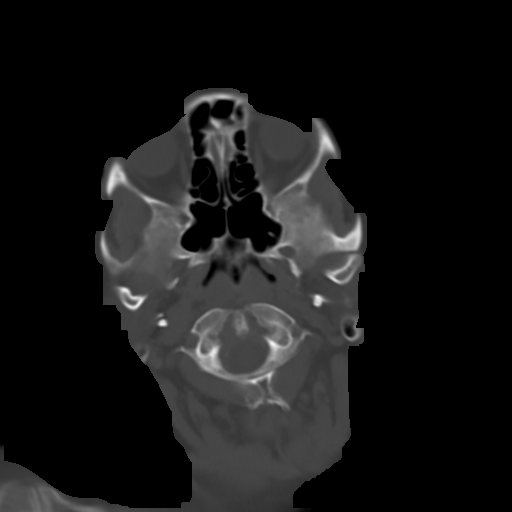
[im 5/30  brain]
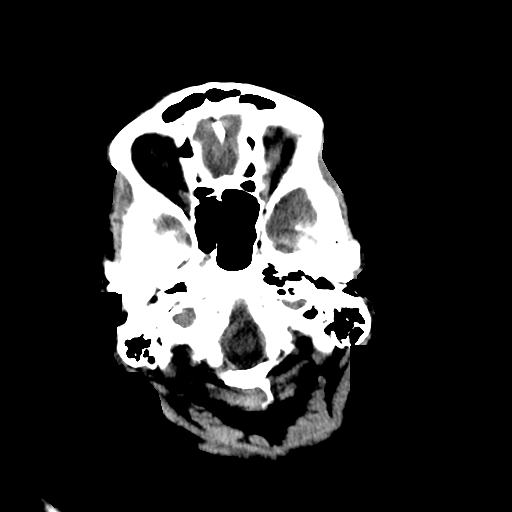
[im 7/30  brain]
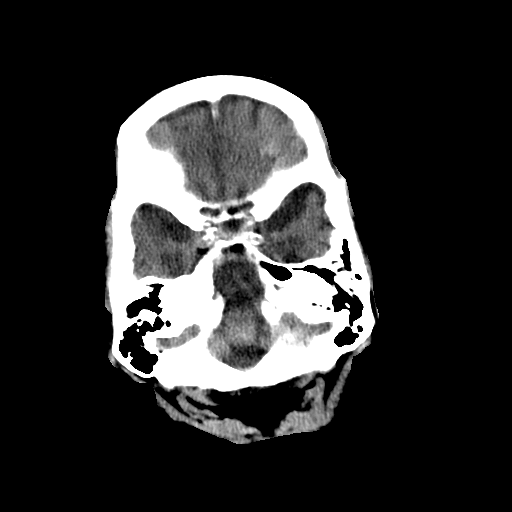
[im 9/30  brain]
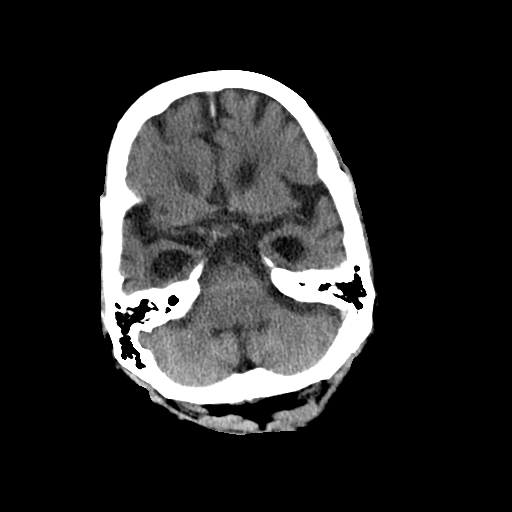
[im 11/30  brain]
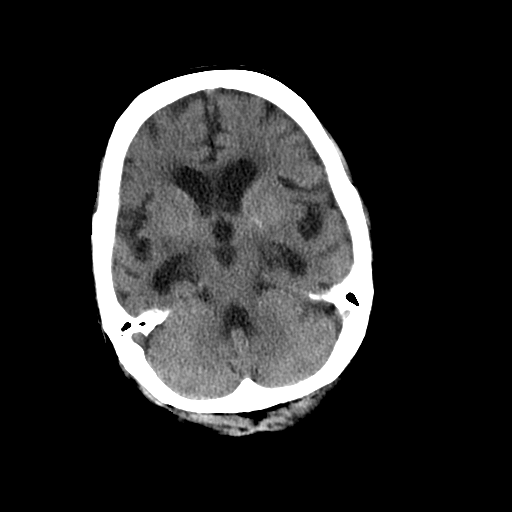
[im 11/30  bone]
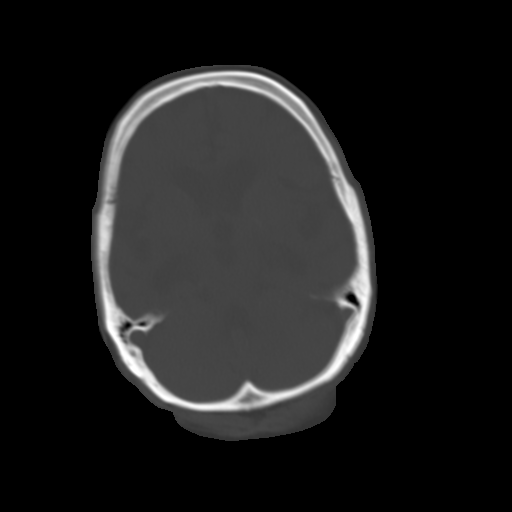
[im 13/30  brain]
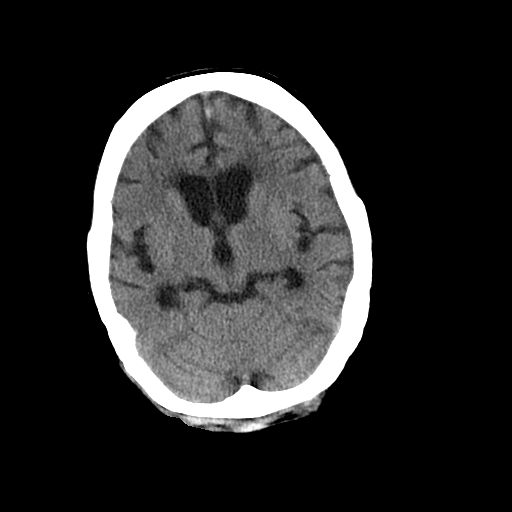
[im 15/30  brain]
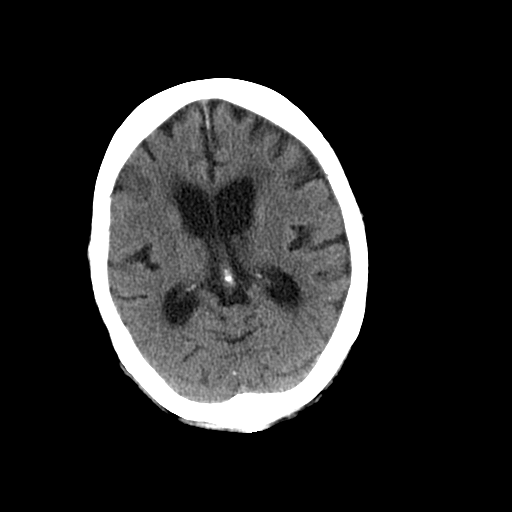
[im 17/30  brain]
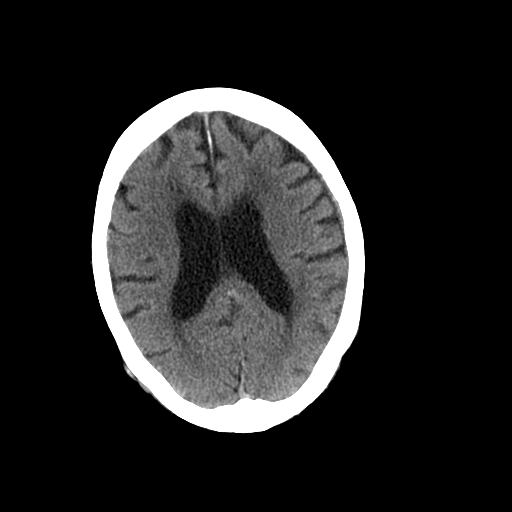
[im 19/30  brain]
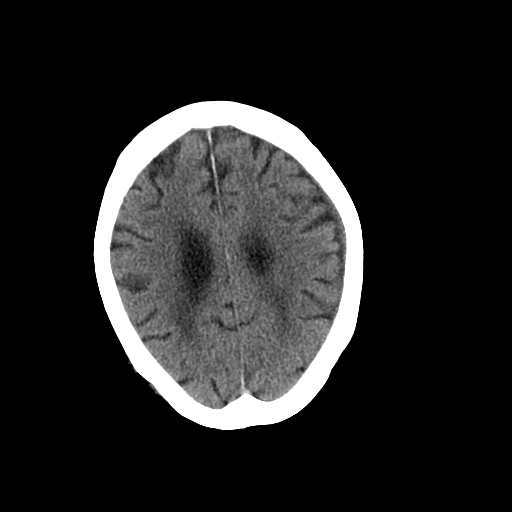
[im 19/30  bone]
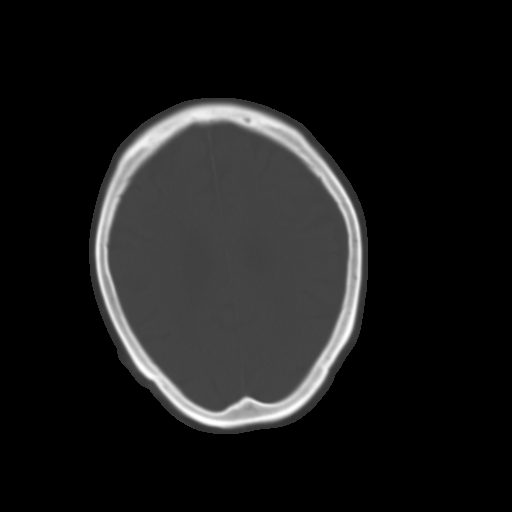
[im 21/30  brain]
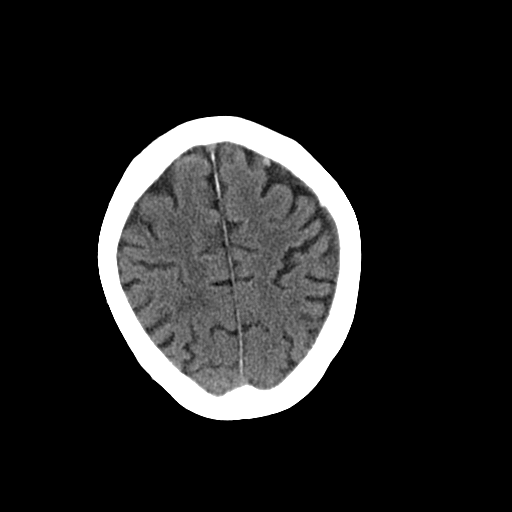
[im 23/30  brain]
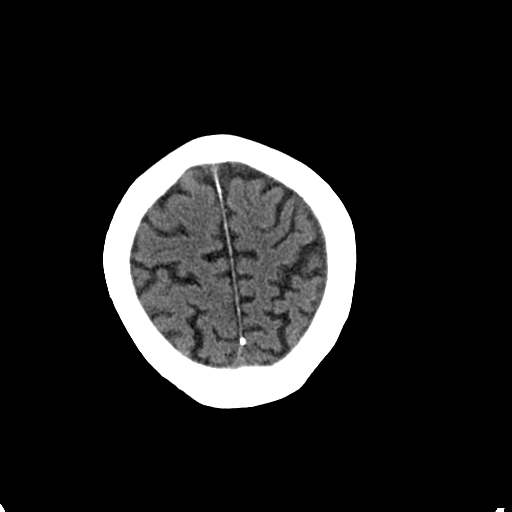
[im 25/30  brain]
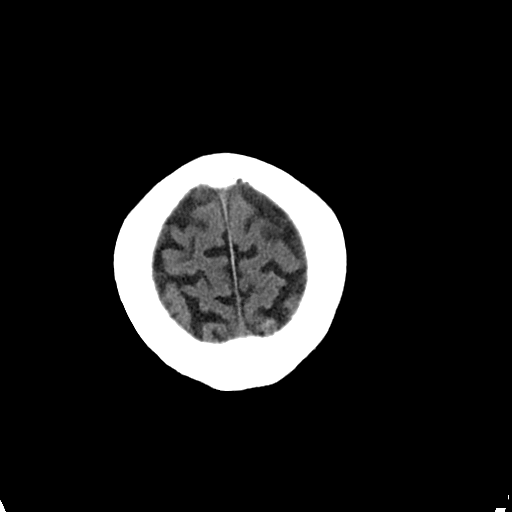
[im 27/30  brain]
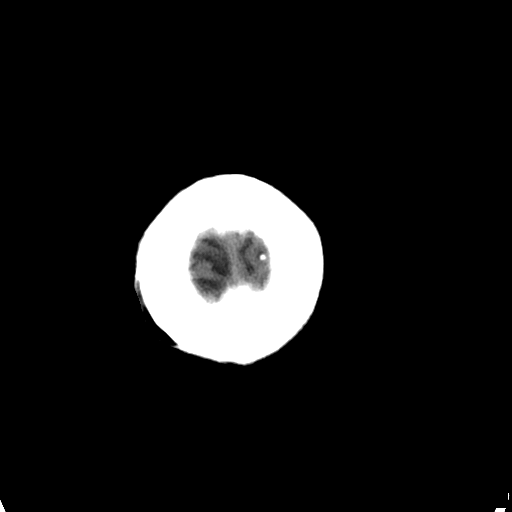
[im 27/30  bone]
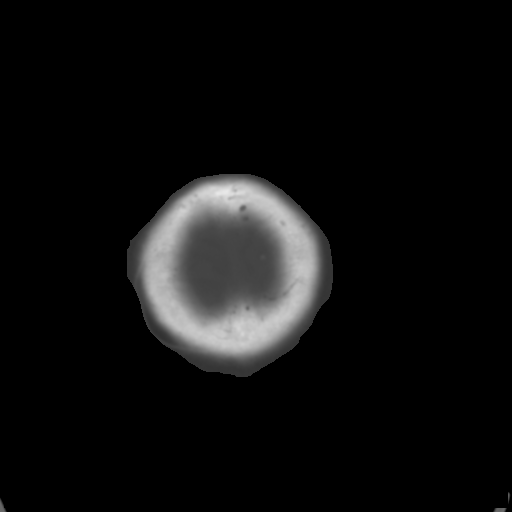

[Series 5: head w/o bone · axial · non-contrast · 0.49mm/px · z∈[+39,+59]mm · 2 of 32 slices shown]
[im 3/32  bone]
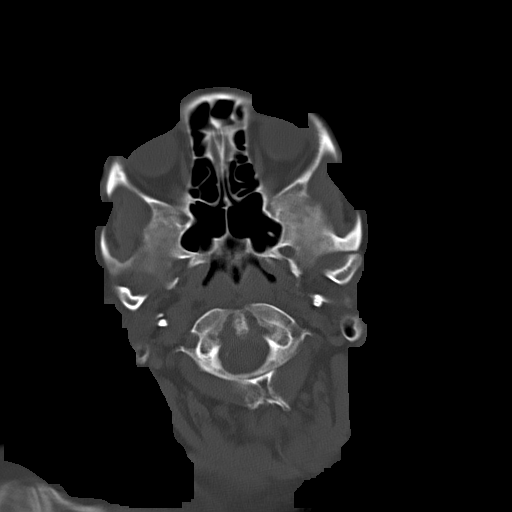
[im 7/32  bone]
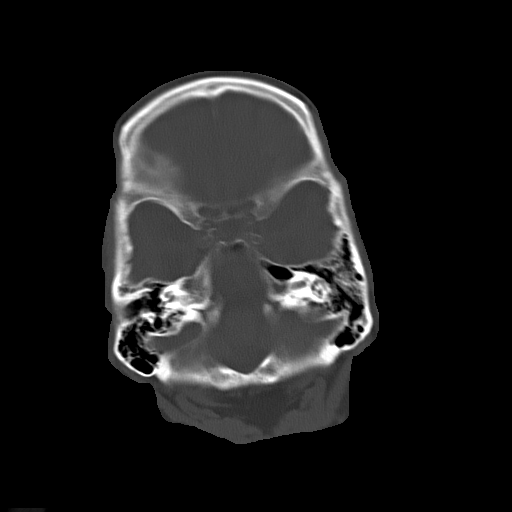

[15 of 30 positions shown; findings below may reference images not displayed]

FINDINGS: There is moderate diffuse atrophy. Ventricles and proportion or
larger than sulci.

There is a subdural hematoma on the left which appear subacute. It
has a maximum thickness of 6 mm. There is no other extra-axial
fluid. There is no midline shift.

There is no mass or intra-axial hemorrhage. There is mild small
vessel disease in the centra semiovale bilaterally. There is no
acute infarct.

The bony calvarium appears intact. The mastoid air cells are clear.
There is a right parietal scalp lipoma measuring 5.4 by 5.2 cm,
stable.
IMPRESSION: Small subacute left subdural hematoma, not causing significant mass
effect and no midline shift.

There is atrophy with questionable superimposed communicating
hydrocephalus. There is mild small vessel disease. No intra-axial
hemorrhage or acute appearing infarct.

Critical Value/emergent results were called by telephone at the time
of interpretation on 11/17/2013 at [DATE] to Dr. TAMONDONG, LALDEV
, who verbally acknowledged these results.
